# Patient Record
Sex: Male | Born: 1955 | Race: Black or African American | Hispanic: No | Marital: Single | Smoking: Current every day smoker
Health system: Southern US, Community
[De-identification: ages and names within clinical notes are randomized; demographics above are authoritative.]

## PROBLEM LIST (undated history)

## (undated) DIAGNOSIS — M1990 Unspecified osteoarthritis, unspecified site: Secondary | ICD-10-CM

## (undated) DIAGNOSIS — I10 Essential (primary) hypertension: Secondary | ICD-10-CM

## (undated) HISTORY — PX: MULTIPLE TOOTH EXTRACTIONS: SHX2053

## (undated) HISTORY — DX: Unspecified osteoarthritis, unspecified site: M19.90

## (undated) HISTORY — DX: Essential (primary) hypertension: I10

---

## 1975-01-24 HISTORY — PX: TONSILLECTOMY: SUR1361

## 1989-01-23 HISTORY — PX: KNEE SURGERY: SHX244

## 2005-11-15 ENCOUNTER — Ambulatory Visit (HOSPITAL_COMMUNITY): Admission: RE | Admit: 2005-11-15 | Discharge: 2005-11-15 | Payer: Self-pay | Admitting: Dermatology

## 2009-07-31 ENCOUNTER — Emergency Department (HOSPITAL_COMMUNITY): Admission: EM | Admit: 2009-07-31 | Discharge: 2009-07-31 | Payer: Self-pay | Admitting: Emergency Medicine

## 2011-04-06 ENCOUNTER — Other Ambulatory Visit: Payer: Self-pay | Admitting: Family Medicine

## 2011-04-06 ENCOUNTER — Ambulatory Visit
Admission: RE | Admit: 2011-04-06 | Discharge: 2011-04-06 | Disposition: A | Discharge status: Home / Self Care | Payer: 59 | Source: Ambulatory Visit | Attending: Family Medicine | Admitting: Family Medicine

## 2011-04-06 DIAGNOSIS — I10 Essential (primary) hypertension: Secondary | ICD-10-CM

## 2011-04-06 DIAGNOSIS — F17200 Nicotine dependence, unspecified, uncomplicated: Secondary | ICD-10-CM

## 2011-05-04 ENCOUNTER — Encounter: Payer: Self-pay | Admitting: Gastroenterology

## 2011-05-11 ENCOUNTER — Ambulatory Visit (AMBULATORY_SURGERY_CENTER): Payer: 59 | Admitting: *Deleted

## 2011-05-11 VITALS — Ht 75.0 in | Wt 148.0 lb

## 2011-05-11 DIAGNOSIS — Z1211 Encounter for screening for malignant neoplasm of colon: Secondary | ICD-10-CM

## 2011-05-11 MED ORDER — PEG-KCL-NACL-NASULF-NA ASC-C 100 G PO SOLR: ORAL | Status: DC

## 2011-05-25 ENCOUNTER — Encounter: Payer: Self-pay | Admitting: Gastroenterology

## 2011-05-25 ENCOUNTER — Ambulatory Visit (AMBULATORY_SURGERY_CENTER): Payer: 59 | Admitting: Gastroenterology

## 2011-05-25 VITALS — BP 136/81 | HR 73 | Temp 95.4°F | Resp 22 | Ht 75.0 in | Wt 148.0 lb

## 2011-05-25 DIAGNOSIS — Z1211 Encounter for screening for malignant neoplasm of colon: Secondary | ICD-10-CM

## 2011-05-25 HISTORY — PX: COLONOSCOPY: SHX174

## 2011-05-25 MED ORDER — SODIUM CHLORIDE 0.9 % IV SOLN: 500.0000 mL | INTRAVENOUS | Status: DC

## 2011-05-25 NOTE — Progress Notes (Signed)
Patient did not have preoperative order for IV antibiotic SSI prophylaxis. (G8918)   

## 2011-05-25 NOTE — Progress Notes (Signed)
Patient did not experience any of the following events: a burn prior to discharge; a fall within the facility; wrong site/side/patient/procedure/implant event; or a hospital transfer or hospital admission upon discharge from the facility. (G8907)  

## 2011-05-25 NOTE — Patient Instructions (Signed)
YOU HAD AN ENDOSCOPIC PROCEDURE TODAY AT THE [facility]: Refer to the procedure report that was given to you for any specific questions about what was found during the examination.  If the procedure report does not answer your questions, please call your gastroenterologist to clarify.  If you requested that your care partner not be given the details of your procedure findings, then the procedure report has been included in a sealed envelope for you to review at your convenience later.  YOU SHOULD EXPECT: Some feelings of bloating in the abdomen. Passage of more gas than usual.  Walking can help get rid of the air that was put into your GI tract during the procedure and reduce the bloating. If you had a lower endoscopy (such as a colonoscopy or flexible sigmoidoscopy) you may notice spotting of blood in your stool or on the toilet paper. If you underwent a bowel prep for your procedure, then you may not have a normal bowel movement for a few days.  DIET: Your first meal following the procedure should be a light meal and then it is ok to progress to your normal diet.  A half-sandwich or bowl of soup is an example of a good first meal.  Heavy or fried foods are harder to digest and may make you feel nauseous or bloated.  Likewise meals heavy in dairy and vegetables can cause extra gas to form and this can also increase the bloating.  Drink plenty of fluids but you should avoid alcoholic beverages for 24 hours.  ACTIVITY: Your care partner should take you home directly after the procedure.  You should plan to take it easy, moving slowly for the rest of the day.  You can resume normal activity the day after the procedure however you should NOT DRIVE or use heavy machinery for 24 hours (because of the sedation medicines used during the test).    SYMPTOMS TO REPORT IMMEDIATELY: A gastroenterologist can be reached at any hour.  During normal business hours, 8:30 AM to 5:00 PM Monday through Friday,  call [phone].  After hours and on weekends, please call the GI answering service at [phone] who will take a message and have the physician on call contact you.   Following lower endoscopy (colonoscopy or flexible sigmoidoscopy):  Excessive amounts of blood in the stool  Significant tenderness or worsening of abdominal pains  Swelling of the abdomen that is new, acute  Fever of 100F or higher    FOLLOW UP: If any biopsies were taken you will be contacted by phone or by letter within the next 1-3 weeks.  Call your gastroenterologist if you have not heard about the biopsies in 3 weeks.  Our staff will call the home number listed on your records the next business day following your procedure to check on you and address any questions or concerns that you may have at that time regarding the information given to you following your procedure. This is a courtesy call and so if there is no answer at the home number and we have not heard from you through the emergency physician on call, we will assume that you have returned to your regular daily activities without incident.  SIGNATURES/CONFIDENTIALITY: You and/or your care partner have signed paperwork which will be entered into your electronic medical record.  These signatures attest to the fact that that the information above on your After Visit Summary has been reviewed and is understood.  Full responsibility of the confidentiality   of this discharge information lies with you and/or your care-partner.     

## 2011-05-25 NOTE — Op Note (Signed)
[facility] [address] [place], [place]  [address]  COLONOSCOPY PROCEDURE REPORT  PATIENT:  Fred Wise, Fred Wise  MR#:  [id number] BIRTHDATE:  [birth year], 55 yrs. old  GENDER:  male ENDOSCOPIST:  Barbette Hair. Arlyce Dice, MD REF. BY:  Clyda Greener, M.D. PROCEDURE DATE:  05/25/2011 PROCEDURE:  Diagnostic Colonoscopy ASA CLASS:  Class I INDICATIONS:  Routine Risk Screening MEDICATIONS:   MAC sedation, administered by CRNA propofol 200mg IV  DESCRIPTION OF PROCEDURE:   After the risks benefits and alternatives of the procedure were thoroughly explained, informed consent was obtained.  Digital rectal exam was performed and revealed no abnormalities.   The LB [id number]-[id number] endoscope was introduced through the anus and advanced to the cecum, which was identified by both the appendix and ileocecal valve, without limitations.  The quality of the prep was good, using MoviPrep. The instrument was then slowly withdrawn as the colon was fully examined. <<PROCEDUREIMAGES>>  FINDINGS:  A normal appearing cecum, ileocecal valve, and appendiceal orifice were identified. The ascending, hepatic flexure, transverse, splenic flexure, descending, sigmoid colon, and rectum appeared unremarkable (see image1 and image2). Retroflexed views in the rectum revealed no abnormalities.    The time to cecum =  1) 6.25  minutes. The scope was then withdrawn in 1) 6.0  minutes from the cecum and the procedure completed. COMPLICATIONS:  None ENDOSCOPIC IMPRESSION: 1) Normal colon RECOMMENDATIONS: 1) Continue current colorectal screening recommendations for "routine risk" patients with a repeat colonoscopy in 10 years. REPEAT EXAM:  In 10 year(s) for Colonoscopy.  ______________________________ Barbette Hair. Arlyce Dice, MD  CC:  n. eSIGNED:   Barbette Hair. Dario Yono at 05/25/2011 09:31 AM  Judeth Horn, [phone]

## 2011-05-26 ENCOUNTER — Telehealth: Payer: Self-pay

## 2011-05-26 NOTE — Telephone Encounter (Signed)
No answer

## 2012-02-09 ENCOUNTER — Ambulatory Visit
Admission: RE | Admit: 2012-02-09 | Discharge: 2012-02-09 | Disposition: A | Discharge status: Home / Self Care | Payer: PRIVATE HEALTH INSURANCE | Source: Ambulatory Visit | Attending: Family Medicine | Admitting: Family Medicine

## 2012-02-09 ENCOUNTER — Other Ambulatory Visit: Payer: Self-pay | Admitting: Family Medicine

## 2012-02-09 DIAGNOSIS — F17200 Nicotine dependence, unspecified, uncomplicated: Secondary | ICD-10-CM

## 2013-01-13 ENCOUNTER — Other Ambulatory Visit: Payer: Self-pay | Admitting: Family Medicine

## 2013-01-13 ENCOUNTER — Ambulatory Visit
Admission: RE | Admit: 2013-01-13 | Discharge: 2013-01-13 | Disposition: A | Discharge status: Home / Self Care | Payer: PRIVATE HEALTH INSURANCE | Source: Ambulatory Visit | Attending: Family Medicine | Admitting: Family Medicine

## 2013-01-13 DIAGNOSIS — F17200 Nicotine dependence, unspecified, uncomplicated: Secondary | ICD-10-CM

## 2013-01-13 DIAGNOSIS — A699 Spirochetal infection, unspecified: Secondary | ICD-10-CM

## 2014-02-20 IMAGING — CR DG CHEST 2V
2 series · 2 of 2 positions shown · non-contrast
Comparison: Chest x-ray of 04/06/2011

CLINICAL DATA: Smoking history, hypertension

CHEST - 2 VIEW

[w chest lat]
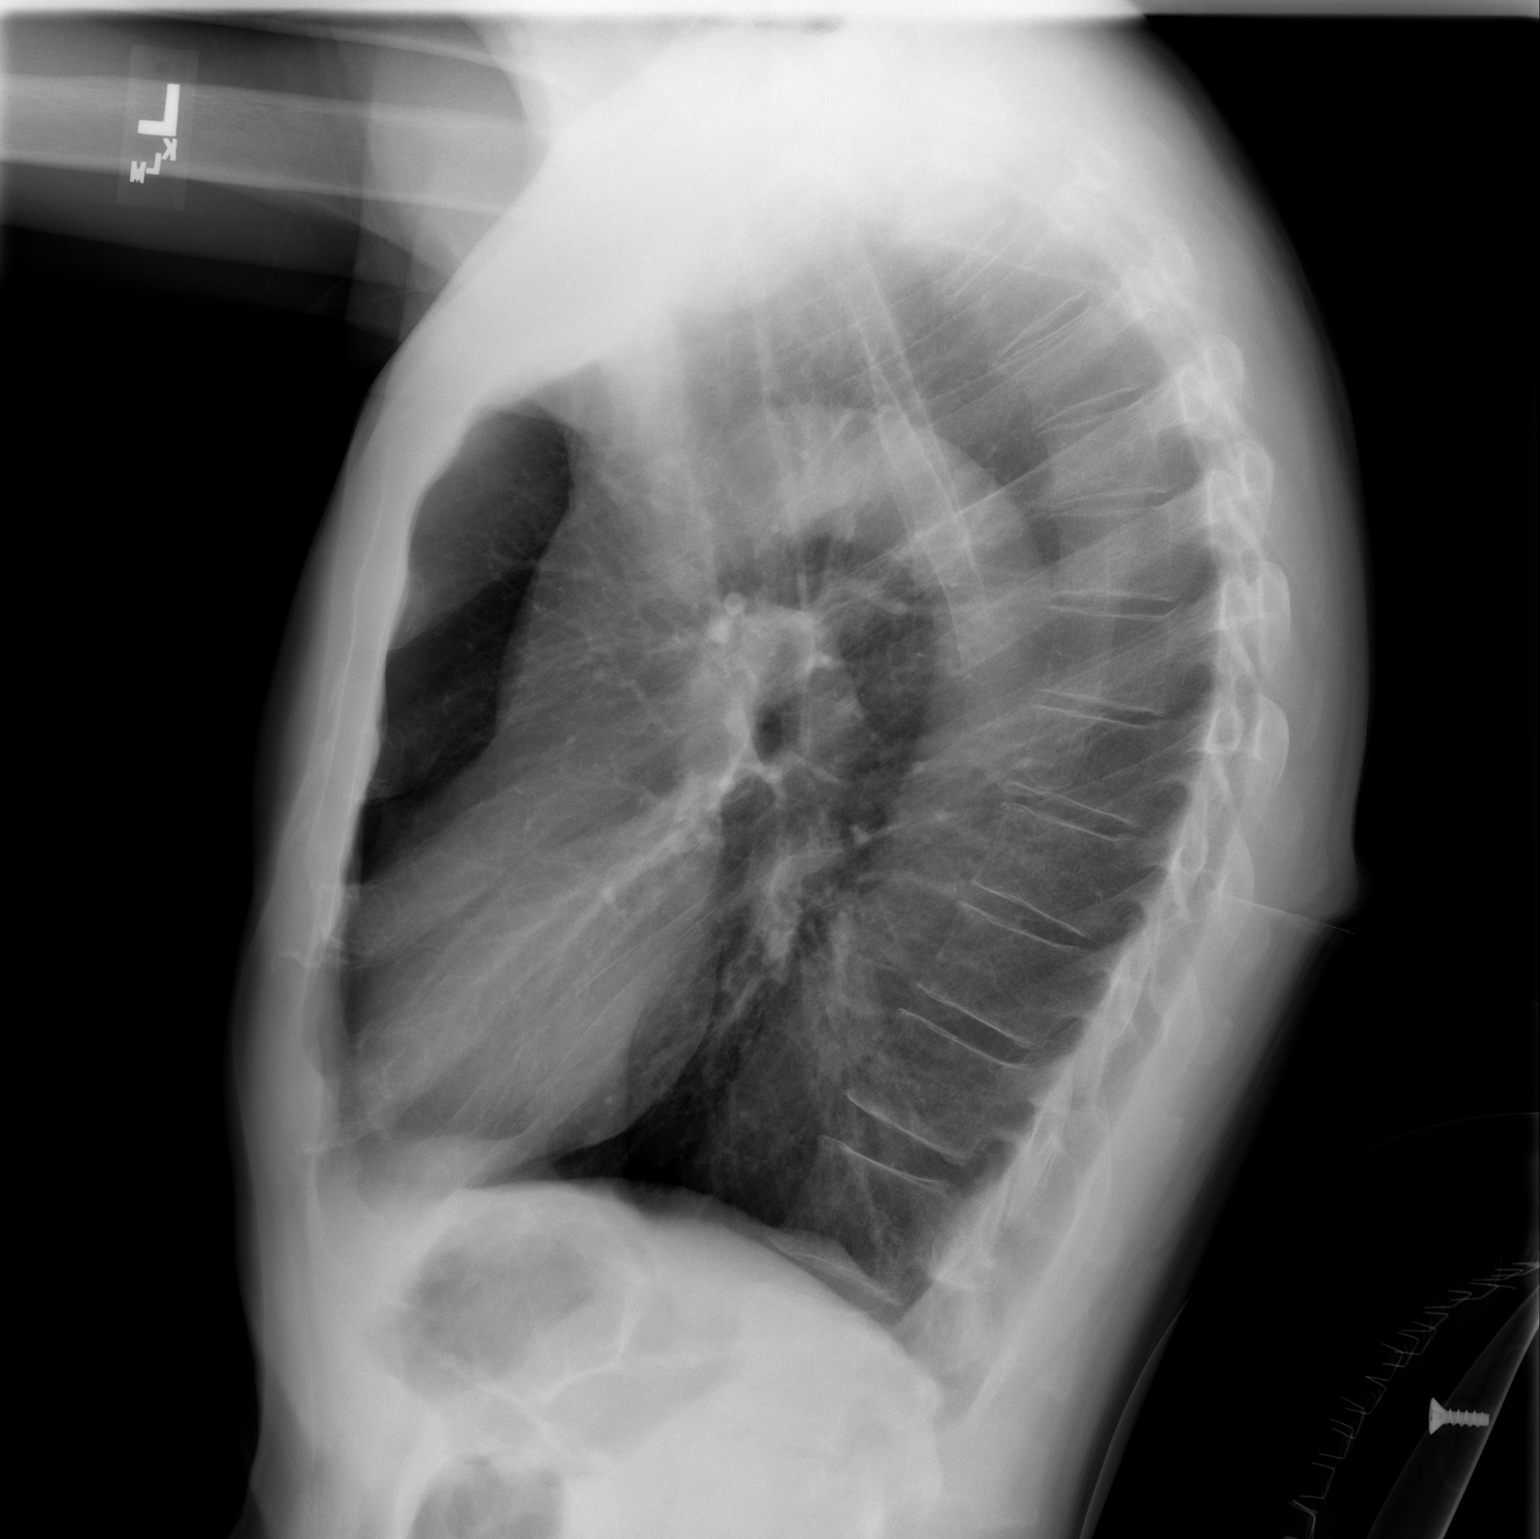

[w chest ap]
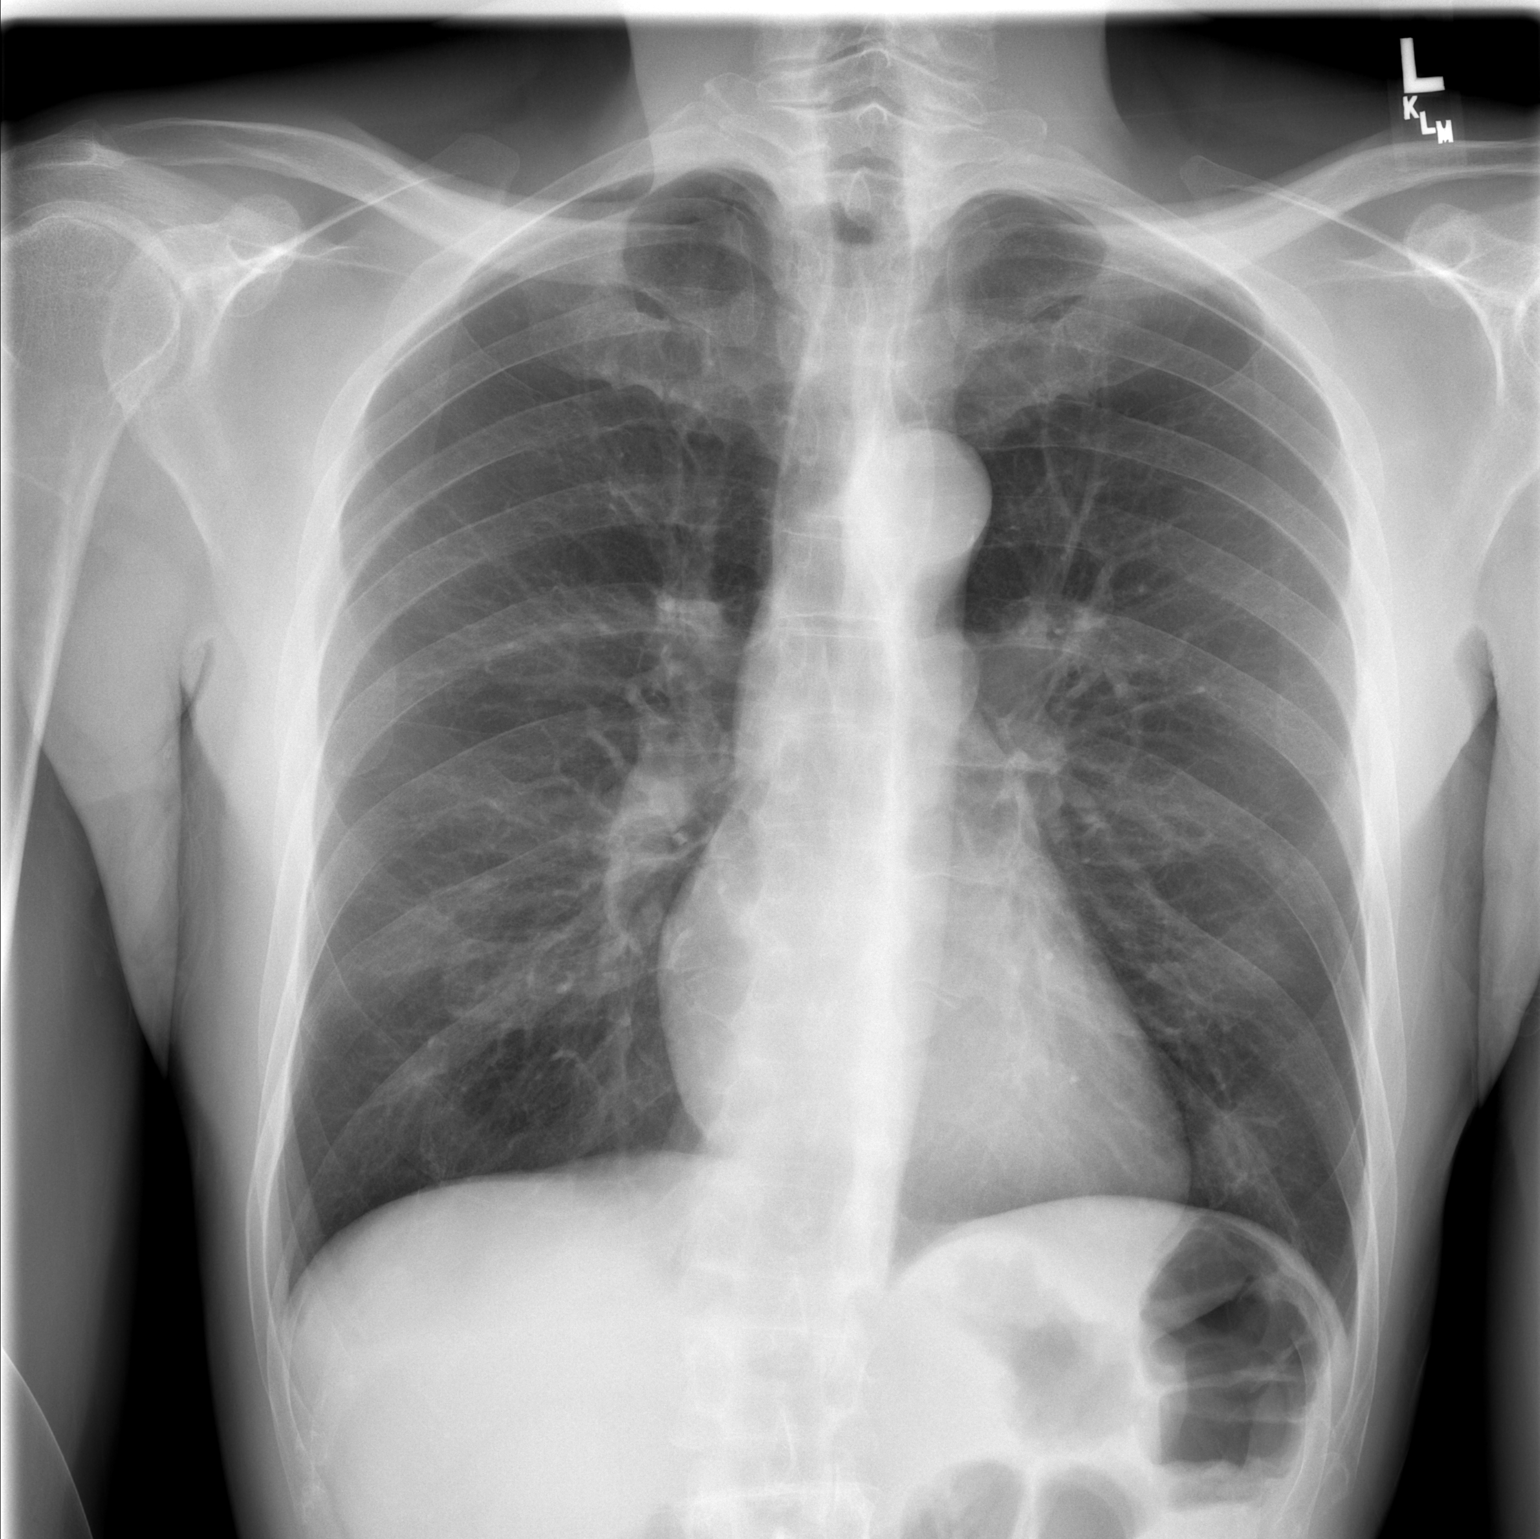

[2 of 2 positions shown; findings below may reference images not displayed]

FINDINGS: The lungs are clear but hyperaerated consistent with
emphysema.  Mediastinal contours are stable.  The heart is within
normal limits in size.  No bony abnormality is seen.
IMPRESSION: Emphysema.  No active lung disease.

## 2014-04-28 ENCOUNTER — Ambulatory Visit
Admission: RE | Admit: 2014-04-28 | Discharge: 2014-04-28 | Disposition: A | Discharge status: Home / Self Care | Payer: PRIVATE HEALTH INSURANCE | Source: Ambulatory Visit | Attending: Family Medicine | Admitting: Family Medicine

## 2014-04-28 ENCOUNTER — Other Ambulatory Visit: Payer: Self-pay | Admitting: Family Medicine

## 2014-04-28 DIAGNOSIS — F17210 Nicotine dependence, cigarettes, uncomplicated: Secondary | ICD-10-CM

## 2014-04-28 DIAGNOSIS — R911 Solitary pulmonary nodule: Secondary | ICD-10-CM

## 2014-05-01 ENCOUNTER — Ambulatory Visit
Admission: RE | Admit: 2014-05-01 | Discharge: 2014-05-01 | Disposition: A | Discharge status: Home / Self Care | Payer: PRIVATE HEALTH INSURANCE | Source: Ambulatory Visit | Attending: Family Medicine | Admitting: Family Medicine

## 2014-05-01 DIAGNOSIS — R911 Solitary pulmonary nodule: Secondary | ICD-10-CM

## 2015-01-25 IMAGING — CR DG CHEST 2V
2 series · 2 of 2 positions shown · non-contrast
Comparison: 02/09/2012

CLINICAL DATA: Spirochetal infection.  Smoker for 40 years.

EXAM:
CHEST  2 VIEW

[w chest pa]
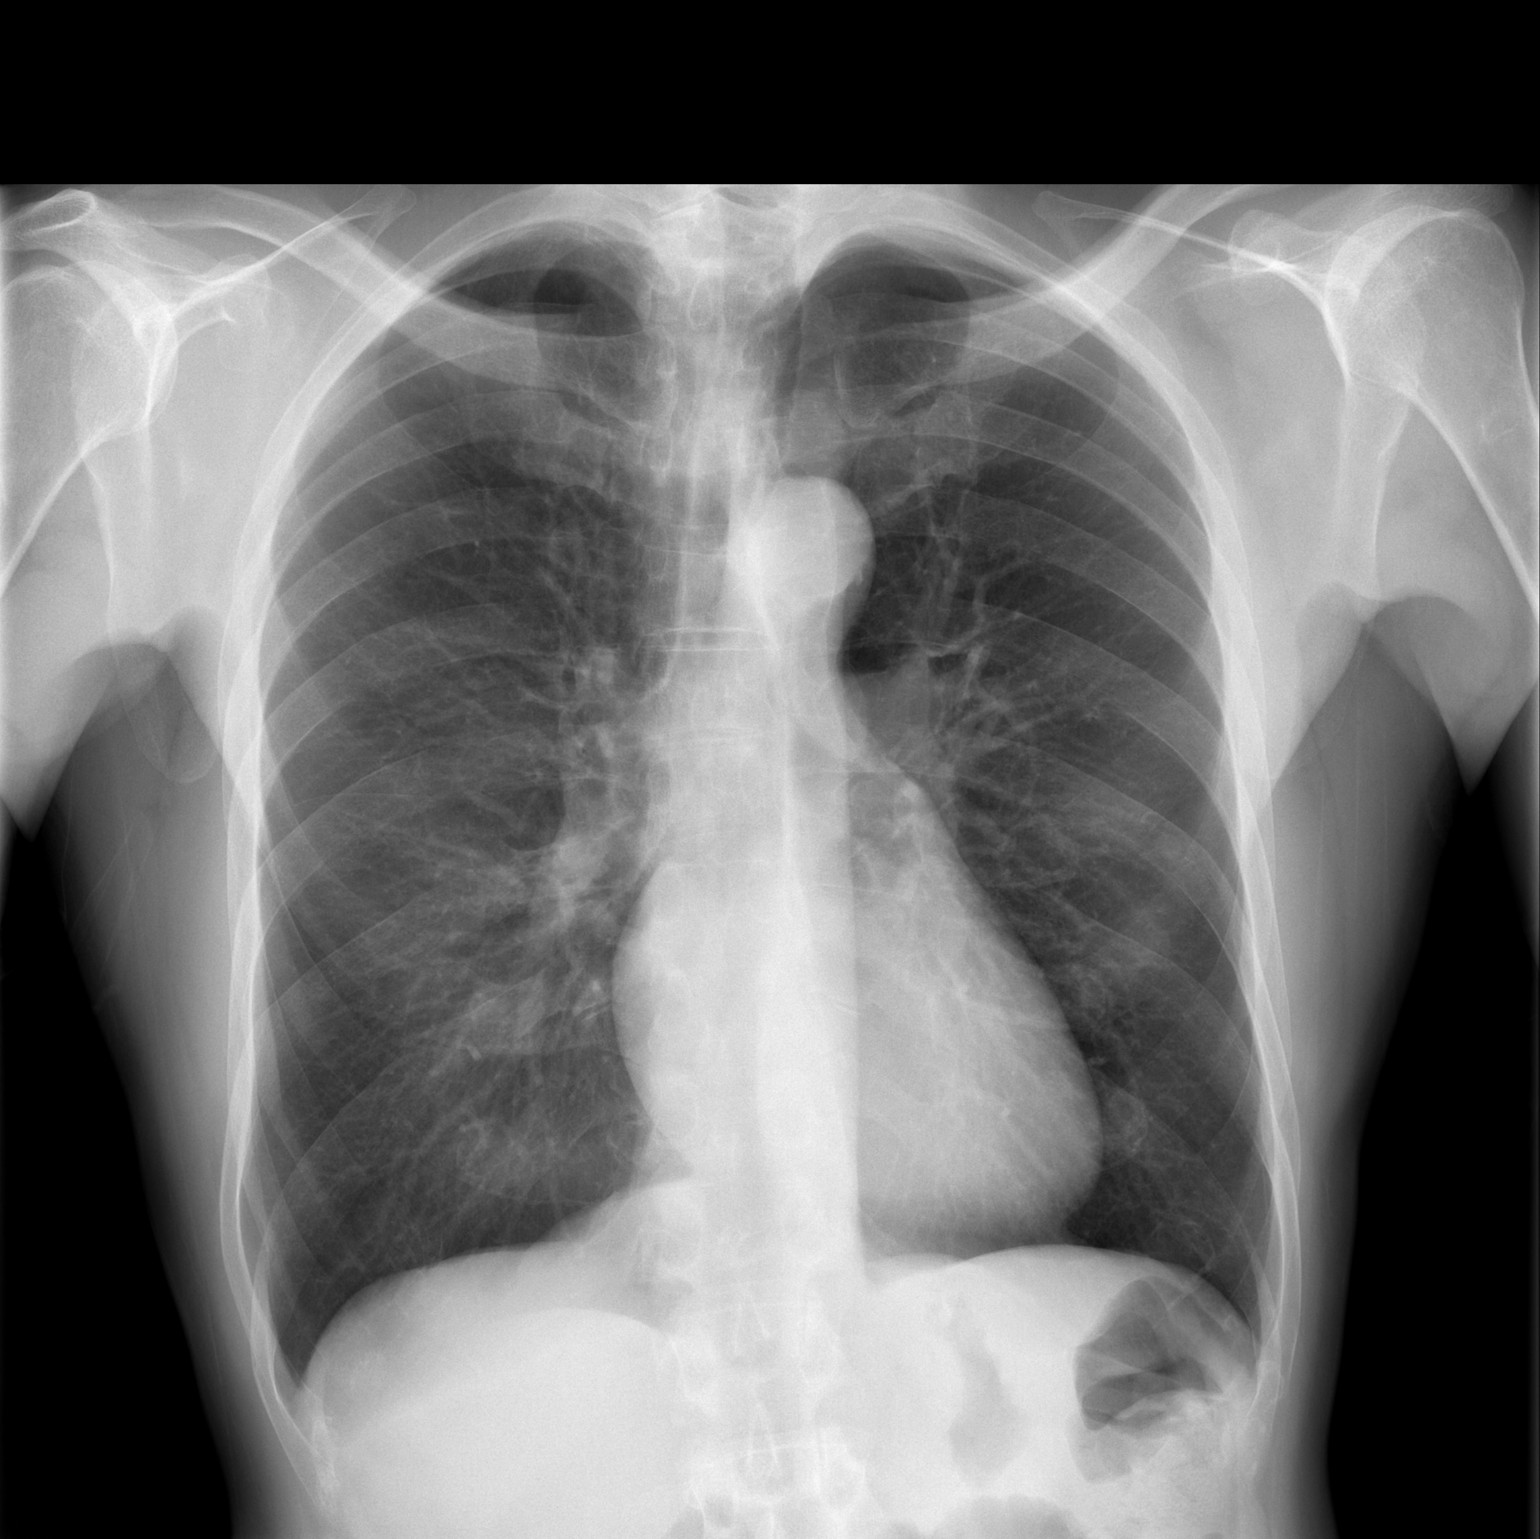

[w chest lat]
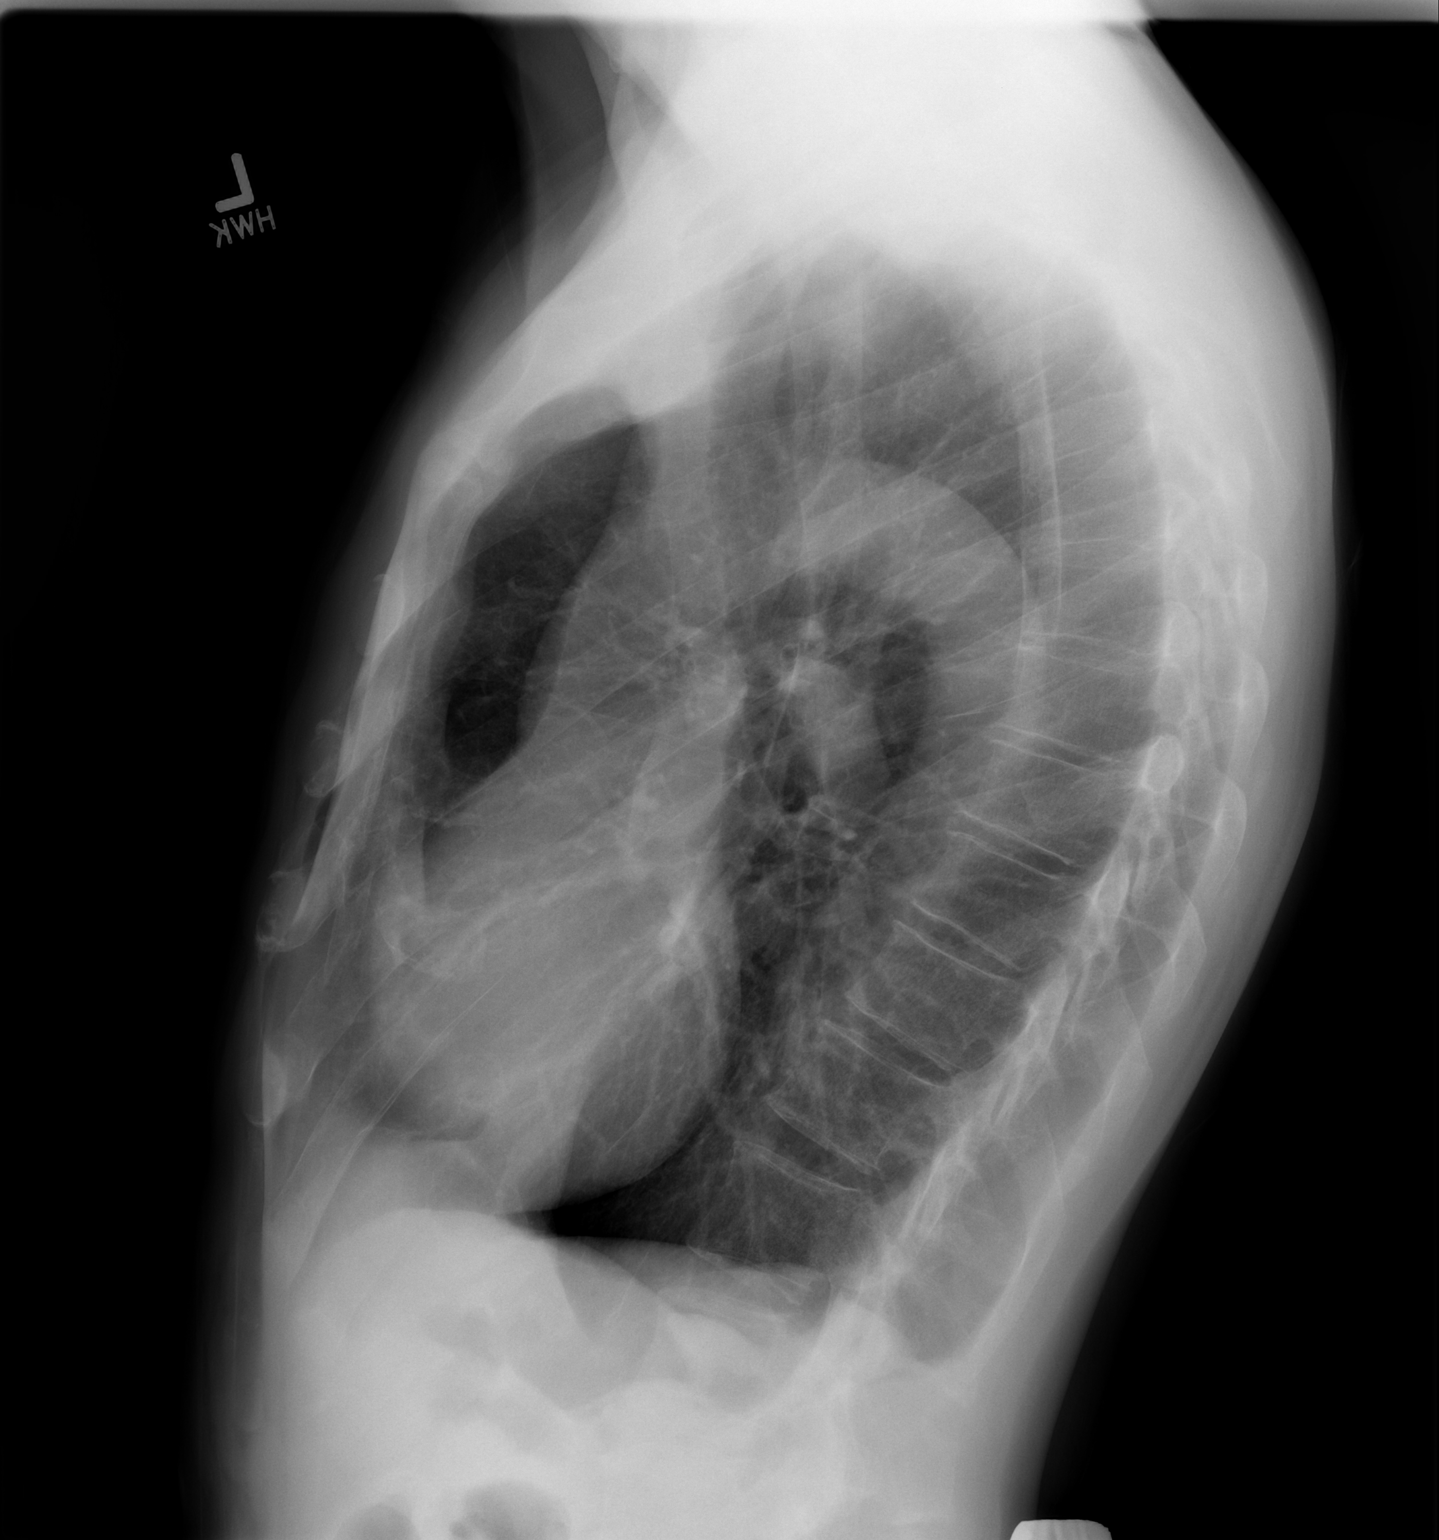

[2 of 2 positions shown; findings below may reference images not displayed]

FINDINGS: Heart size and pulmonary vascularity are normal and the lungs are
clear although hyperinflated. No osseous abnormality.
IMPRESSION: Hyperinflated lungs which could represent emphysema. No acute
abnormality.

## 2015-03-08 ENCOUNTER — Emergency Department (HOSPITAL_COMMUNITY)
Admission: EM | Admit: 2015-03-08 | Discharge: 2015-03-08 | Disposition: A | Discharge status: Home / Self Care | Payer: Medicare Other | Attending: Emergency Medicine | Admitting: Emergency Medicine

## 2015-03-08 ENCOUNTER — Encounter (HOSPITAL_COMMUNITY): Payer: Self-pay | Admitting: *Deleted

## 2015-03-08 DIAGNOSIS — H01004 Unspecified blepharitis left upper eyelid: Secondary | ICD-10-CM | POA: Insufficient documentation

## 2015-03-08 DIAGNOSIS — H5712 Ocular pain, left eye: Secondary | ICD-10-CM | POA: Diagnosis not present

## 2015-03-08 DIAGNOSIS — Z79899 Other long term (current) drug therapy: Secondary | ICD-10-CM | POA: Diagnosis not present

## 2015-03-08 DIAGNOSIS — Z8739 Personal history of other diseases of the musculoskeletal system and connective tissue: Secondary | ICD-10-CM | POA: Insufficient documentation

## 2015-03-08 DIAGNOSIS — I10 Essential (primary) hypertension: Secondary | ICD-10-CM | POA: Insufficient documentation

## 2015-03-08 DIAGNOSIS — H02844 Edema of left upper eyelid: Secondary | ICD-10-CM | POA: Diagnosis not present

## 2015-03-08 DIAGNOSIS — H02846 Edema of left eye, unspecified eyelid: Secondary | ICD-10-CM

## 2015-03-08 DIAGNOSIS — F17210 Nicotine dependence, cigarettes, uncomplicated: Secondary | ICD-10-CM | POA: Insufficient documentation

## 2015-03-08 NOTE — ED Notes (Signed)
Pt is here with left eye swelling that came up 2 days ago.

## 2015-03-08 NOTE — ED Notes (Signed)
Declined W/C at D/C and was escorted to lobby by RN. 

## 2015-03-08 NOTE — Discharge Instructions (Signed)
Please follow-up with your doctor next week for reevaluation as needed. You may continue using ice packs to help with the swelling. Return to ED for new or worsening symptoms as we discussed.

## 2015-03-08 NOTE — ED Provider Notes (Signed)
CSN: [id number]     Arrival date & time 03/08/15  1218 History  By signing my name below, I, Iona Beard, attest that this documentation has been prepared under the direction and in the presence of Velna Hatchet, PA-C.  Electronically Signed: Iona Beard, ED Scribe 03/08/2015 at 2:07 PM.    Chief Complaint  Patient presents with  . Eye Problem    The history is provided by the patient. No language interpreter was used.   HPI Comments: Fred Wise is a 60 y.o. male who presents to the Emergency Department complaining of gradual onset, constant left eyelid swelling onset three days ago. Pt states that he began prescription for arthritis on Thursday and thinks that his eye symptoms may be a reaction to the medication. No other associated symptoms. Denies eye pain, eye drainage, photophobia, ear pain, throat pain, rhinorrhea, visual disturbances, or any other associated symptoms.    Past Medical History  Diagnosis Date  . Hypertension   . Arthritis     knees, uses wheelchair   Past Surgical History  Procedure Laterality Date  . Multiple tooth extractions    . Knee surgery  1991    left  . Tonsillectomy  1977   Family History  Problem Relation Age of Onset  . Colon cancer Neg Hx   . Stomach cancer Neg Hx    Social History  Substance Use Topics  . Smoking status: Current Every Day Smoker -- 0.10 packs/day    Types: Cigarettes  . Smokeless tobacco: Never Used  . Alcohol Use: No    Review of Systems  HENT: Negative for rhinorrhea and sore throat.   Eyes: Positive for pain and redness. Negative for photophobia, discharge, itching and visual disturbance.  Neurological: Negative for headaches.  All other systems reviewed and are negative.   Allergies  Review of patient's allergies indicates no known allergies.  Home Medications   Prior to Admission medications   Medication Sig Start Date End Date Taking? Authorizing Provider  amLODipine (NORVASC) 5 MG tablet  Take 5 mg by mouth daily.    Historical Provider, MD   BP 143/103 mmHg  Pulse 65  Temp(Src) 97.8 F (36.6 C) (Oral)  Resp 18  SpO2 99% Physical Exam  Constitutional: He appears well-developed and well-nourished. No distress.  HENT:  Head: Normocephalic and atraumatic.  Eyes: Conjunctivae and EOM are normal. Pupils are equal, round, and reactive to light. Right eye exhibits no discharge. Left eye exhibits no discharge. No scleral icterus.  Mild swelling of left, upper eyelid. No TTP. No discharge.  No erythema or other sign of infection.   Neck: Neck supple. No tracheal deviation present.  Cardiovascular: Normal rate.   Pulmonary/Chest: Effort normal. No respiratory distress.  Musculoskeletal: Normal range of motion.  Neurological: He is alert.  Skin: Skin is warm and dry.  Psychiatric: He has a normal mood and affect. His behavior is normal.    ED Course  Procedures (including critical care time) DIAGNOSTIC STUDIES: Oxygen Saturation is 99% on RA, normal by my interpretation.    COORDINATION OF CARE:  2:01 PM-Discussed treatment plan which includes cold compression for swelling with pt at bedside and pt agreed to plan.   Labs Review Labs Reviewed - No data to display  Imaging Review No results found.   EKG Interpretation None     Meds given in ED:  Medications - No data to display  Discharge Medication List as of 03/08/2015  2:07 PM     Filed  Vitals:   03/08/15 1337 03/08/15 1415  BP: 143/103 158/85  Pulse: 65 78  Temp: 97.8 F (36.6 C) 98 F (36.7 C)  TempSrc: Oral Oral  Resp: 18 18  SpO2: 99% 100%    MDM  Adilson Grafton is a 60 y.o. male options for evaluation of left eye swelling. On exam, patient does have left eyelid swelling, very mild. Consistent with mild blepharitis. No evidence of stye/hordeolum, infection or other acute ocular emergency. Encouraged continue cool compresses and follow-up with PCP. He verbalizes understanding and agrees with  plan. Hemodynamically stable, afebrile and appropriate for discharge.  The patient appears reasonably screened and/or stabilized for discharge and I doubt any other medical condition or other [facility] requiring further screening, evaluation, or treatment in the ED at this time prior to discharge.   Final diagnoses:  Swelling of eyelid, left  I personally performed the services described in this documentation, which was scribed in my presence. The recorded information has been reviewed and is accurate.      Joycie Peek, PA-C 03/08/15 1438  Arby Barrette, MD 03/10/15 [phone]

## 2016-05-09 IMAGING — CR DG CHEST 2V
2 series · 2 of 2 positions shown · non-contrast
Comparison: 01/13/2013.

CLINICAL DATA: Heavy smoker.  Hypertension.

EXAM:
CHEST  2 VIEW

[w chest pa]
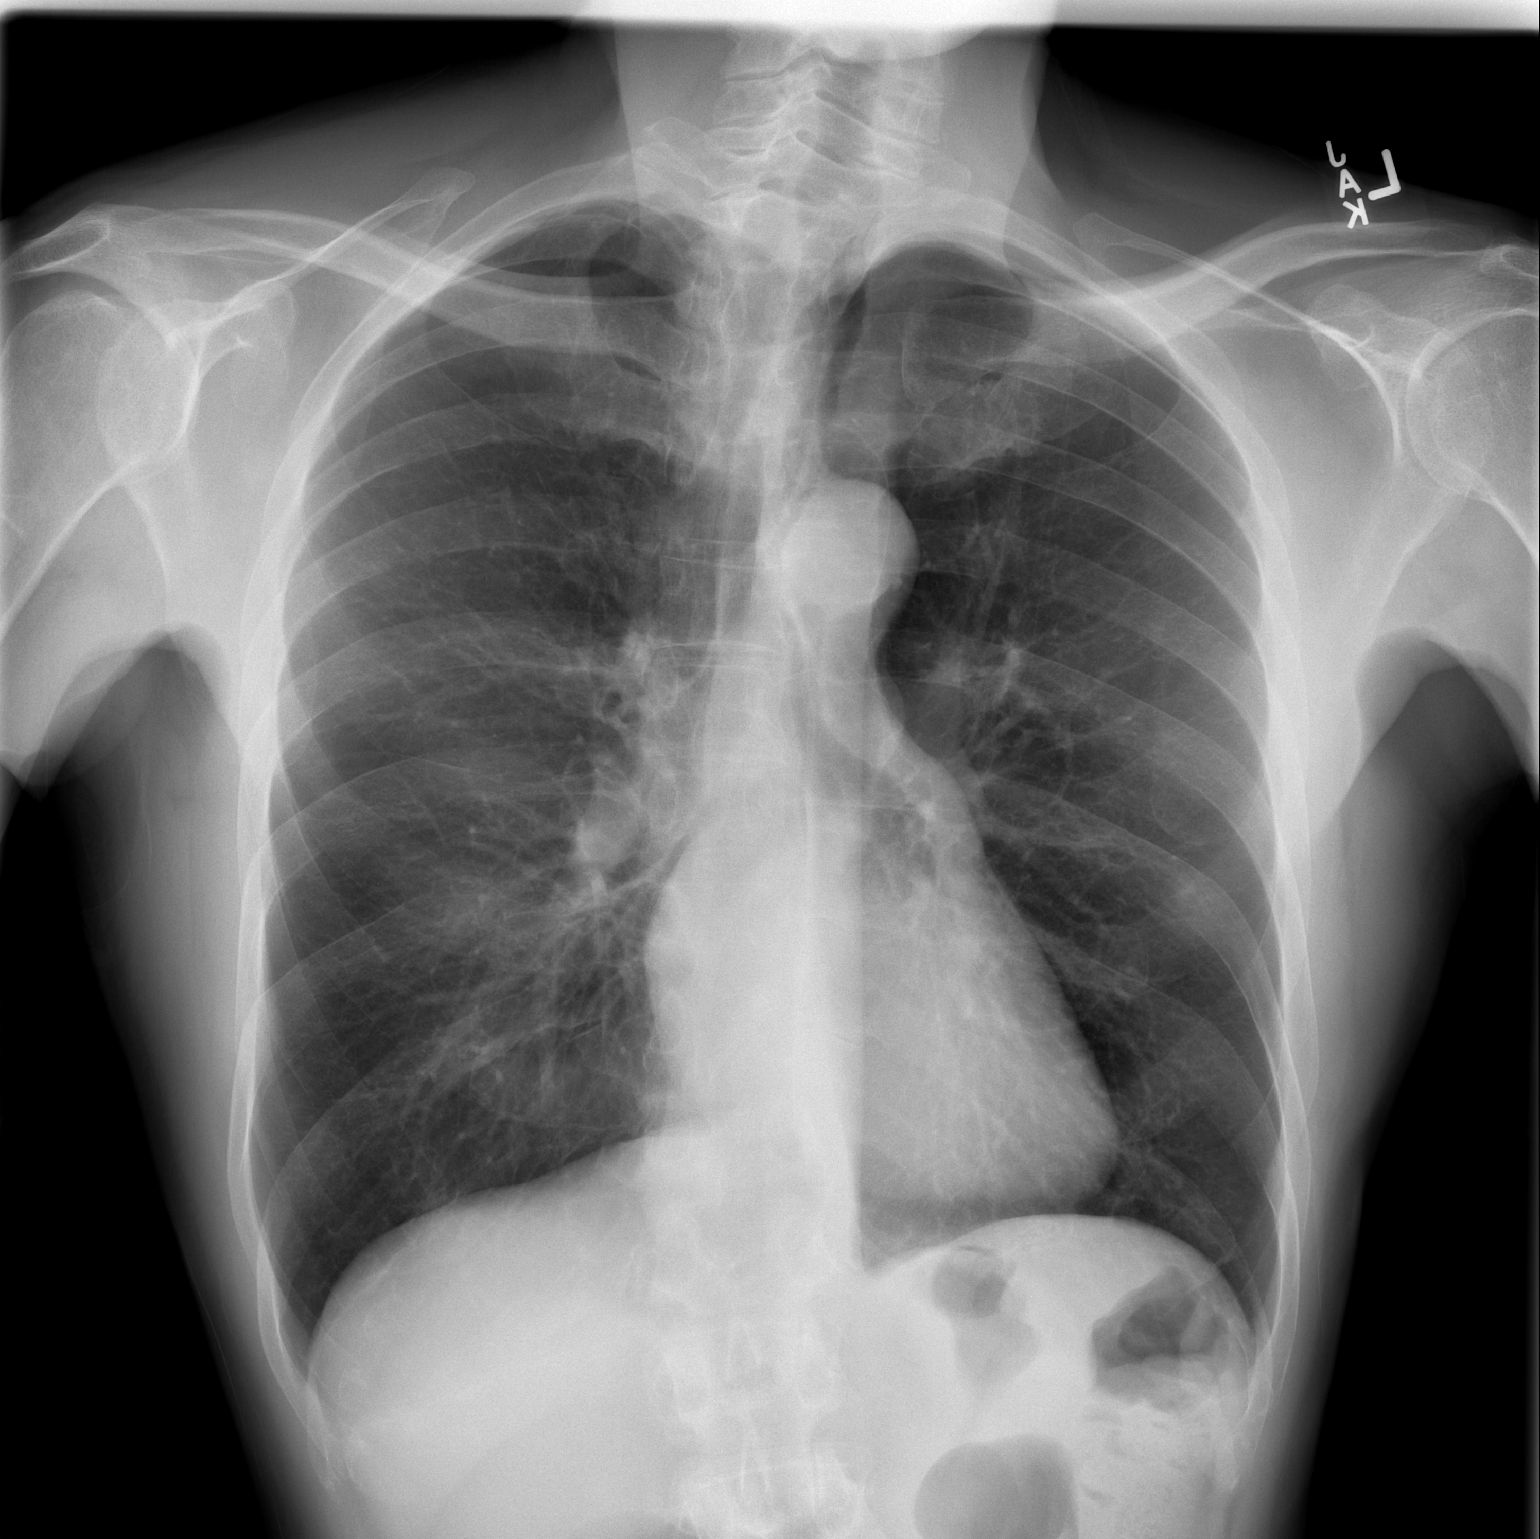

[w chest lat]
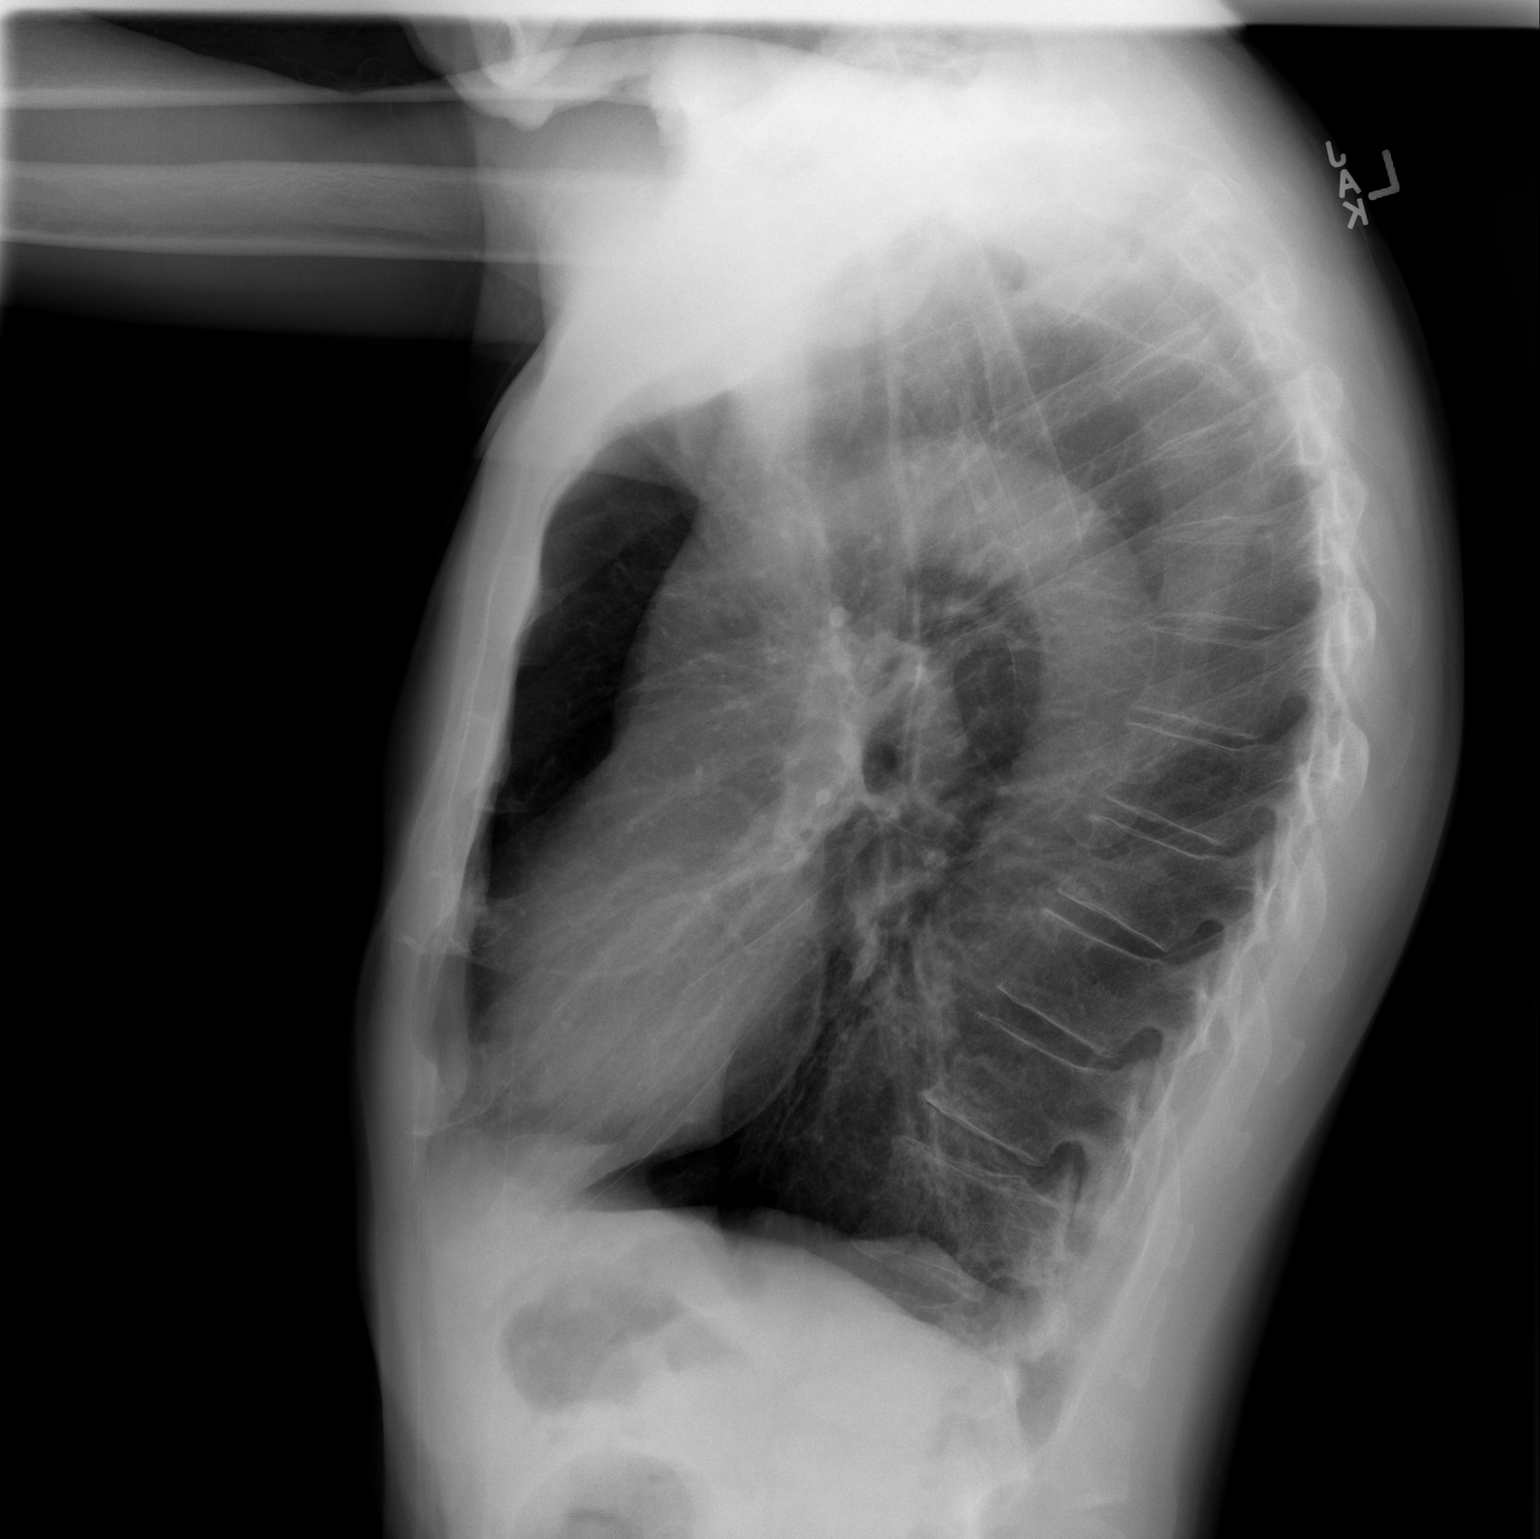

[2 of 2 positions shown; findings below may reference images not displayed]

FINDINGS: Cardiopericardial silhouette is within normal limits. Mediastinal
contours are normal. The lungs are hyperinflated compatible with
emphysema. There is a faint density in the LEFT mid lung, not well
seen on the lateral view. This appears changed compared to the prior
exam from 7003, with poor definition of the margins on today's
examination. This could represent an atypical appearance of a nipple
shadow however given the asymmetry it is more concerning for
pulmonary nodule.
IMPRESSION: 1. Emphysema.
2. LEFT mid lung nodular density. Pulmonary nodule not excluded.
Followup noncontrast chest CT recommended.
3. These results will be called to the ordering clinician or
representative by the Radiologist Assistant, and communication
documented in the PACS or zVision Dashboard.

## 2018-02-16 ENCOUNTER — Observation Stay (HOSPITAL_COMMUNITY)
Admission: EM | Admit: 2018-02-16 | Discharge: 2018-02-17 | Disposition: A | Discharge status: Home / Self Care | Payer: Medicare Other | Attending: Family Medicine | Admitting: Family Medicine

## 2018-02-16 ENCOUNTER — Other Ambulatory Visit: Payer: Self-pay

## 2018-02-16 ENCOUNTER — Emergency Department (HOSPITAL_COMMUNITY): Payer: Medicare Other

## 2018-02-16 DIAGNOSIS — J101 Influenza due to other identified influenza virus with other respiratory manifestations: Principal | ICD-10-CM | POA: Diagnosis present

## 2018-02-16 DIAGNOSIS — E440 Moderate protein-calorie malnutrition: Secondary | ICD-10-CM | POA: Diagnosis not present

## 2018-02-16 DIAGNOSIS — Z791 Long term (current) use of non-steroidal anti-inflammatories (NSAID): Secondary | ICD-10-CM | POA: Diagnosis not present

## 2018-02-16 DIAGNOSIS — F17210 Nicotine dependence, cigarettes, uncomplicated: Secondary | ICD-10-CM | POA: Insufficient documentation

## 2018-02-16 DIAGNOSIS — Z681 Body mass index (BMI) 19 or less, adult: Secondary | ICD-10-CM | POA: Diagnosis not present

## 2018-02-16 DIAGNOSIS — I10 Essential (primary) hypertension: Secondary | ICD-10-CM | POA: Diagnosis not present

## 2018-02-16 DIAGNOSIS — M6281 Muscle weakness (generalized): Secondary | ICD-10-CM | POA: Diagnosis not present

## 2018-02-16 DIAGNOSIS — E43 Unspecified severe protein-calorie malnutrition: Secondary | ICD-10-CM

## 2018-02-16 DIAGNOSIS — J9601 Acute respiratory failure with hypoxia: Secondary | ICD-10-CM

## 2018-02-16 DIAGNOSIS — J111 Influenza due to unidentified influenza virus with other respiratory manifestations: Secondary | ICD-10-CM | POA: Diagnosis present

## 2018-02-16 DIAGNOSIS — M170 Bilateral primary osteoarthritis of knee: Secondary | ICD-10-CM | POA: Insufficient documentation

## 2018-02-16 DIAGNOSIS — R0902 Hypoxemia: Secondary | ICD-10-CM | POA: Diagnosis not present

## 2018-02-16 DIAGNOSIS — R2681 Unsteadiness on feet: Secondary | ICD-10-CM | POA: Insufficient documentation

## 2018-02-16 DIAGNOSIS — Z79899 Other long term (current) drug therapy: Secondary | ICD-10-CM | POA: Diagnosis not present

## 2018-02-16 LAB — BASIC METABOLIC PANEL
Anion gap: 14 (ref 5–15)
BUN: 13 mg/dL (ref 8–23)
CALCIUM: 9 mg/dL (ref 8.9–10.3)
CHLORIDE: 100 mmol/L (ref 98–111)
CO2: 23 mmol/L (ref 22–32)
CREATININE: 1.02 mg/dL (ref 0.61–1.24)
GFR calc Af Amer: >60 mL/min (ref >60)
GFR calc non Af Amer: >60 mL/min (ref >60)
GLUCOSE: 84 mg/dL (ref 70–99)
Potassium: 3.6 mmol/L (ref 3.5–5.1)
Sodium: 137 mmol/L (ref 135–145)

## 2018-02-16 LAB — INFLUENZA PANEL BY PCR (TYPE A & B)
INFLBPCR: NEGATIVE
Influenza A By PCR: POSITIVE — AB

## 2018-02-16 LAB — CBC WITH DIFFERENTIAL/PLATELET
Abs Immature Granulocytes: 0.02 10*3/uL (ref 0.00–0.07)
BASOS ABS: 0 10*3/uL (ref 0.0–0.1)
Basophils Relative: 0 %
EOS ABS: 0.2 10*3/uL (ref 0.0–0.5)
EOS PCT: 3 %
HEMATOCRIT: 48.6 % (ref 39.0–52.0)
HEMOGLOBIN: 15.8 g/dL (ref 13.0–17.0)
Immature Granulocytes: 0 %
LYMPHS ABS: 0.9 10*3/uL (ref 0.7–4.0)
LYMPHS PCT: 15 %
MCH: 29.5 pg (ref 26.0–34.0)
MCHC: 32.5 g/dL (ref 30.0–36.0)
MCV: 90.8 fL (ref 80.0–100.0)
MONO ABS: 0.7 10*3/uL (ref 0.1–1.0)
Monocytes Relative: 11 %
NRBC: 0 % (ref 0.0–0.2)
Neutro Abs: 4.5 10*3/uL (ref 1.7–7.7)
Neutrophils Relative %: 71 %
Platelets: 192 10*3/uL (ref 150–400)
RBC: 5.35 MIL/uL (ref 4.22–5.81)
RDW: 14.5 % (ref 11.5–15.5)
WBC: 6.3 10*3/uL (ref 4.0–10.5)

## 2018-02-16 LAB — HIV ANTIBODY (ROUTINE TESTING W REFLEX): HIV Screen 4th Generation wRfx: NONREACTIVE

## 2018-02-16 MED ORDER — IPRATROPIUM-ALBUTEROL 0.5-2.5 (3) MG/3ML IN SOLN
3.0000 mL | Freq: Two times a day (BID) | RESPIRATORY_TRACT | Status: DC
  Administered 2018-02-17: 3 mL via RESPIRATORY_TRACT
  Filled 2018-02-16: qty 3

## 2018-02-16 MED ORDER — ACETAMINOPHEN 325 MG PO TABS: 650.0000 mg | ORAL_TABLET | Freq: Four times a day (QID) | ORAL | Status: DC | PRN

## 2018-02-16 MED ORDER — ENSURE ENLIVE PO LIQD
237.0000 mL | Freq: Three times a day (TID) | ORAL | Status: DC
  Administered 2018-02-16 – 2018-02-17 (×2): 237 mL via ORAL

## 2018-02-16 MED ORDER — OSELTAMIVIR PHOSPHATE 75 MG PO CAPS
75.0000 mg | ORAL_CAPSULE | Freq: Two times a day (BID) | ORAL | Status: DC
  Administered 2018-02-16 – 2018-02-17 (×2): 75 mg via ORAL
  Filled 2018-02-16 (×2): qty 1

## 2018-02-16 MED ORDER — ALBUTEROL SULFATE (2.5 MG/3ML) 0.083% IN NEBU: 2.5000 mg | INHALATION_SOLUTION | RESPIRATORY_TRACT | Status: DC | PRN

## 2018-02-16 MED ORDER — SODIUM CHLORIDE 0.9 % IV BOLUS
1000.0000 mL | Freq: Once | INTRAVENOUS | Status: AC
  Administered 2018-02-16: 1000 mL via INTRAVENOUS

## 2018-02-16 MED ORDER — ALBUTEROL SULFATE (2.5 MG/3ML) 0.083% IN NEBU: 2.5000 mg | INHALATION_SOLUTION | Freq: Four times a day (QID) | RESPIRATORY_TRACT | Status: DC

## 2018-02-16 MED ORDER — ENOXAPARIN SODIUM 40 MG/0.4ML ~~LOC~~ SOLN
40.0000 mg | SUBCUTANEOUS | Status: DC
  Administered 2018-02-16: 40 mg via SUBCUTANEOUS
  Filled 2018-02-16: qty 0.4

## 2018-02-16 MED ORDER — IPRATROPIUM-ALBUTEROL 0.5-2.5 (3) MG/3ML IN SOLN
3.0000 mL | Freq: Four times a day (QID) | RESPIRATORY_TRACT | Status: DC
  Administered 2018-02-16 (×2): 3 mL via RESPIRATORY_TRACT
  Filled 2018-02-16 (×2): qty 3

## 2018-02-16 MED ORDER — OSELTAMIVIR PHOSPHATE 75 MG PO CAPS
75.0000 mg | ORAL_CAPSULE | Freq: Once | ORAL | Status: AC
  Administered 2018-02-16: 75 mg via ORAL
  Filled 2018-02-16: qty 1

## 2018-02-16 MED ORDER — BENZONATATE 100 MG PO CAPS: 200.0000 mg | ORAL_CAPSULE | Freq: Three times a day (TID) | ORAL | Status: DC | PRN

## 2018-02-16 MED ORDER — ADULT MULTIVITAMIN W/MINERALS CH
1.0000 | ORAL_TABLET | Freq: Every day | ORAL | Status: DC
  Administered 2018-02-16 – 2018-02-17 (×2): 1 via ORAL
  Filled 2018-02-16 (×2): qty 1

## 2018-02-16 MED ORDER — BENZONATATE 100 MG PO CAPS
200.0000 mg | ORAL_CAPSULE | Freq: Once | ORAL | Status: AC
  Administered 2018-02-16: 200 mg via ORAL
  Filled 2018-02-16: qty 2

## 2018-02-16 MED ORDER — IPRATROPIUM-ALBUTEROL 0.5-2.5 (3) MG/3ML IN SOLN
3.0000 mL | Freq: Once | RESPIRATORY_TRACT | Status: AC
  Administered 2018-02-16: 3 mL via RESPIRATORY_TRACT
  Filled 2018-02-16: qty 3

## 2018-02-16 MED ORDER — AMLODIPINE BESYLATE 5 MG PO TABS
5.0000 mg | ORAL_TABLET | Freq: Every day | ORAL | Status: DC
  Administered 2018-02-16 – 2018-02-17 (×2): 5 mg via ORAL
  Filled 2018-02-16 (×2): qty 1

## 2018-02-16 MED ORDER — IPRATROPIUM BROMIDE 0.02 % IN SOLN
0.5000 mg | Freq: Four times a day (QID) | RESPIRATORY_TRACT | Status: DC
  Administered 2018-02-16: 0.5 mg via RESPIRATORY_TRACT
  Filled 2018-02-16: qty 2.5

## 2018-02-16 MED ORDER — METHYLPREDNISOLONE SODIUM SUCC 40 MG IJ SOLR
40.0000 mg | Freq: Four times a day (QID) | INTRAMUSCULAR | Status: DC
  Administered 2018-02-16 – 2018-02-17 (×5): 40 mg via INTRAVENOUS
  Filled 2018-02-16 (×5): qty 1

## 2018-02-16 NOTE — H&P (Signed)
History and Physical    Fred Wise GGE:[id number] DOB: 12-23-1955 DOA: 02/16/2018  PCP: Gilda Crease, MD  Patient coming from: Home  I have personally briefly reviewed patient's old medical records in [facility]  Chief Complaint: ILI  HPI: Fred Wise is a 63 y.o. male with medical history significant of HTN.  Patient presents to the ED with c/o cough, fever, runny nose, SOB.  Symptoms onset 3-4 days ago, persistent.  Nothing makes better or worse.  No known h/o COPD.   ED Course: Influenza A positive.  SOB and desats to 83% with ambulation.  Satting 95% at rest however on RA.  EDP requests admission.   Review of Systems: As per HPI otherwise 10 point review of systems negative.   Past Medical History:  Diagnosis Date  . Arthritis    knees, uses wheelchair  . Hypertension     Past Surgical History:  Procedure Laterality Date  . KNEE SURGERY  1991   left  . MULTIPLE TOOTH EXTRACTIONS    . TONSILLECTOMY  1977     reports that he has been smoking cigarettes. He has been smoking about 0.10 packs per day. He has never used smokeless tobacco. He reports that he does not drink alcohol or use drugs.  No Known Allergies  Family History  Problem Relation Age of Onset  . Colon cancer Neg Hx   . Stomach cancer Neg Hx      Prior to Admission medications   Medication Sig Start Date End Date Taking? Authorizing Provider  amLODipine (NORVASC) 5 MG tablet Take 5 mg by mouth daily.   Yes [provider]  meloxicam (MOBIC) 7.5 MG tablet Take 7.5 mg by mouth daily.   Yes [provider]    Physical Exam: Vitals:   02/16/18 0054 02/16/18 0100 02/16/18 0200 02/16/18 0230  BP:  (!) 140/94 (!) 135/96 (!) 174/88  Pulse:  (!) 119 (!) 116 (!) 115  Resp:  (!) 25 (!) 26 (!) 22  Temp:      SpO2:  95% 96% 96%  Height: 6\' 3"  (1.905 m)       Constitutional: NAD, calm, comfortable Eyes: PERRL, lids and conjunctivae normal ENMT: Mucous membranes are  moist. Posterior pharynx clear of any exudate or lesions.Normal dentition.  Neck: normal, supple, no masses, no thyromegaly Respiratory: Few wheezes bilaterally. Cardiovascular: Mild tachycardia.  Abdomen: no tenderness, no masses palpated. No hepatosplenomegaly. Bowel sounds positive.  Musculoskeletal: no clubbing / cyanosis. No joint deformity upper and lower extremities. Good ROM, no contractures. Normal muscle tone.  Skin: no rashes, lesions, ulcers. No induration Neurologic: CN 2-12 grossly intact. Sensation intact, DTR normal. Strength 5/5 in all 4.  Psychiatric: Normal judgment and insight. Alert and oriented x 3. Normal mood.    Labs on Admission: I have personally reviewed following labs and imaging studies  CBC: Recent Labs  Lab 02/16/18 0149  WBC 6.3  NEUTROABS 4.5  HGB 15.8  HCT 48.6  MCV 90.8  PLT 192   Basic Metabolic Panel: Recent Labs  Lab 02/16/18 0149  NA 137  K 3.6  CL 100  CO2 23  GLUCOSE 84  BUN 13  CREATININE 1.02  CALCIUM 9.0   GFR: CrCl cannot be calculated (Unknown ideal weight.). Liver Function Tests: No results for input(s): AST, ALT, ALKPHOS, BILITOT, PROT, ALBUMIN in the last 168 hours. No results for input(s): LIPASE, AMYLASE in the last 168 hours. No results for input(s): AMMONIA in the last 168 hours.  Coagulation Profile: No results for input(s): INR, PROTIME in the last 168 hours. Cardiac Enzymes: No results for input(s): CKTOTAL, CKMB, CKMBINDEX, TROPONINI in the last 168 hours. BNP (last 3 results) No results for input(s): PROBNP in the last 8760 hours. HbA1C: No results for input(s): HGBA1C in the last 72 hours. CBG: No results for input(s): GLUCAP in the last 168 hours. Lipid Profile: No results for input(s): CHOL, HDL, LDLCALC, TRIG, CHOLHDL, LDLDIRECT in the last 72 hours. Thyroid Function Tests: No results for input(s): TSH, T4TOTAL, FREET4, T3FREE, THYROIDAB in the last 72 hours. Anemia Panel: No results for input(s):  VITAMINB12, FOLATE, FERRITIN, TIBC, IRON, RETICCTPCT in the last 72 hours. Urine analysis: No results found for: COLORURINE, APPEARANCEUR, LABSPEC, PHURINE, GLUCOSEU, HGBUR, BILIRUBINUR, KETONESUR, PROTEINUR, UROBILINOGEN, NITRITE, LEUKOCYTESUR  Radiological Exams on Admission: Dg Chest 2 View  Result Date: 02/16/2018 CLINICAL DATA:  Cough EXAM: CHEST - 2 VIEW COMPARISON:  Chest CT 05/01/2014 FINDINGS: The lungs are hyperinflated with diffuse interstitial prominence. No focal airspace consolidation or pulmonary edema. No pleural effusion or pneumothorax. Normal cardiomediastinal contours. IMPRESSION: COPD without acute airspace disease. Electronically Signed   By: Deatra RobinsonKevin  Herman M.D.   On: 02/16/2018 01:33    EKG: Independently reviewed.  Assessment/Plan Principal Problem:   Influenza A Active Problems:   Hypoxia   Essential hypertension    1. Influenza A - 1. Tamiflu 2. Tylenol PRN fever 2. Hypoxia - 1. Only with ambulation 2. satting mid 90s at rest at this point 3. Scheduled atrovent 4. PRN albuterol 3. HTN - 1. Continue home amlodipine  DVT prophylaxis: Lovenox Code Status: Full Family Communication: No family in room Disposition Plan: Home after admit Consults called: None Admission status: Place in obs   Tamico Mundo, Heywood IlesJARED M. DO [facility] Pager [phone] Only works nights!  If 7AM-7PM, please contact the primary day team physician taking care of patient  [email or web address] Password [email or web address]  02/16/2018, 5:01 AM

## 2018-02-16 NOTE — Progress Notes (Signed)
New Admission Note:  Arrival Method: By stretcher from ED around [phone] Mental Orientation: Alert and oriented Telemetry: None Assessment: Completed Skin: Completed, refer to flowsheets IV: Left A.C. Pain: Denies Tubes: None Safety Measures: Safety Fall Prevention Plan was given, discussed  Admission: Completed [facility] Orientation: Patient has been orientated to the room, unit and the staff. Family: None  Orders have been reviewed and implemented. Will continue to monitor the patient. Call light has been placed within reach and bed alarm has been activated.   Alfonse Ras, RN  Phone Number: [phone]

## 2018-02-16 NOTE — ED Notes (Signed)
Pt. Tried walking but quickly became SOB and asking to sit down. Sats dropped down to 83% and quickly returned to 95% once seated and taking deep breaths.

## 2018-02-16 NOTE — Evaluation (Signed)
Physical Therapy Evaluation Patient Details Name: Fred Wise MRN: [id number] DOB: 1955/08/07 Today's Date: 02/16/2018   History of Present Illness  Pt is a 63 y/o male who presents with multiple day history of cough and cold symptoms. Pt was found to be Flu type A positive, with hypoxia noted on ambulation. PMH significant for L knee surgery 1991, Bilateral knee arthritis, HTN.   Clinical Impression  Pt admitted with above diagnosis. Pt currently with functional limitations due to the deficits listed below (see PT Problem List). At the time of PT eval pt was able to perform transfers and ambulation with occasional min assist for balance support and safety with gait training. Pt maintaining sats >90% at rest on RA, however with activity, sats began to drop slightly. With coughing fit, sats dropped significantly with lowest noted reading at 79% on RA. See below for details. Anticipate pt will be safe to d/c home and mobility will improve as flu symptoms improve. Pt will benefit from skilled PT to increase their independence and safety with mobility to allow discharge to the venue listed below.       Follow Up Recommendations No PT follow up;Supervision for mobility/OOB    Equipment Recommendations  None recommended by PT    Recommendations for Other Services       Precautions / Restrictions Precautions Precautions: None Precaution Comments: Watch O2      Mobility  Bed Mobility Overal bed mobility: Independent                Transfers Overall transfer level: Modified independent Equipment used: None             General transfer comment: Increased time. No unsteadiness or LOB noted.   Ambulation/Gait Ambulation/Gait assistance: Min assist Gait Distance (Feet): 150 Feet Assistive device: 1 person hand held assist Gait Pattern/deviations: Step-through pattern;Decreased stride length;Trunk flexed;Decreased weight shift to right;Decreased weight shift to left;Wide base  of support Gait velocity: Decreased Gait velocity interpretation: 1.31 - 2.62 ft/sec, indicative of limited community ambulator General Gait Details: Overall decreased knee flexion and pt with a waddling type gait pattern due to baseline knee deficits. Pt maintaining O2 sats consistently 88-90% on RA during gait, however stopped due to coughing fit before turning around and sats dropped to 87%. Upon return to room pt had another more severe coughing fit and sats decreased to 79%. Quick return to >90% with replacement on 2L/min supplemental O2.   Stairs            Wheelchair Mobility    Modified Rankin (Stroke Patients Only)       Balance Overall balance assessment: Needs assistance Sitting-balance support: Feet supported;No upper extremity supported Sitting balance-Leahy Scale: Normal     Standing balance support: No upper extremity supported;During functional activity Standing balance-Leahy Scale: Poor Standing balance comment: Occasional assist required for balance support                             Pertinent Vitals/Pain Pain Assessment: No/denies pain    Home Living Family/patient expects to be discharged to:: Private residence Living Arrangements: Alone Available Help at Discharge: Neighbor;Available PRN/intermittently Type of Home: Apartment Home Access: Level entry;Elevator     Home Layout: One level Home Equipment: Cane - single point;Wheelchair - manual      Prior Function Level of Independence: Independent with assistive device(s)         Comments: Occasionally uses wheelchair for community distance due  to B knee arthritis     Hand Dominance        Extremity/Trunk Assessment   Upper Extremity Assessment Upper Extremity Assessment: Overall WFL for tasks assessed    Lower Extremity Assessment Lower Extremity Assessment: Generalized weakness(Consistent with knee pain/arthritis)    Cervical / Trunk Assessment Cervical / Trunk  Assessment: Other exceptions Cervical / Trunk Exceptions: Forward head and rounded shoulder posture. Generally flexed trunk with ambulation.   Communication   Communication: No difficulties  Cognition Arousal/Alertness: Awake/alert Behavior During Therapy: WFL for tasks assessed/performed Overall Cognitive Status: Within Functional Limits for tasks assessed                                        General Comments      Exercises     Assessment/Plan    PT Assessment Patient needs continued PT services  PT Problem List Decreased strength;Decreased activity tolerance;Decreased balance;Decreased mobility;Decreased knowledge of use of DME;Decreased safety awareness;Decreased knowledge of precautions;Cardiopulmonary status limiting activity       PT Treatment Interventions DME instruction;Gait training;Stair training;Functional mobility training;Therapeutic activities;Therapeutic exercise;Neuromuscular re-education;Patient/family education    PT Goals (Current goals can be found in the Care Plan section)  Acute Rehab PT Goals Patient Stated Goal: Get better and go home PT Goal Formulation: With patient Time For Goal Achievement: 02/23/18 Potential to Achieve Goals: Good    Frequency Min 3X/week   Barriers to discharge        Co-evaluation               AM-PAC PT "6 Clicks" Mobility  Outcome Measure Help needed turning from your back to your side while in a flat bed without using bedrails?: None Help needed moving from lying on your back to sitting on the side of a flat bed without using bedrails?: None Help needed moving to and from a bed to a chair (including a wheelchair)?: A Little Help needed standing up from a chair using your arms (e.g., wheelchair or bedside chair)?: None Help needed to walk in hospital room?: A Little Help needed climbing 3-5 steps with a railing? : A Little 6 Click Score: 21    End of Session Equipment Utilized During  Treatment: Oxygen Activity Tolerance: Patient limited by fatigue Patient left: in bed;with call bell/phone within reach Nurse Communication: Mobility status PT Visit Diagnosis: Unsteadiness on feet (R26.81);Muscle weakness (generalized) (M62.81)    Time: 1030-1050 PT Time Calculation (min) (ACUTE ONLY): 20 min   Charges:   PT Evaluation $PT Eval Moderate Complexity: 1 Mod          Conni Slipper, PT, DPT Acute Rehabilitation Services Pager: [phone] Office: [phone]   Marylynn Pearson 02/16/2018, 12:01 PM

## 2018-02-16 NOTE — ED Triage Notes (Signed)
Patient c/o cough, cold and and "runny nose like water" x 3-4 days.

## 2018-02-16 NOTE — Progress Notes (Signed)
Initial Nutrition Assessment  DOCUMENTATION CODES:  Non-severe (moderate) malnutrition in context of social or environmental circumstances, Underweight  INTERVENTION:  Ensure Enlive po TID, each supplement provides 350 kcal and 20 grams of protein  MVI with minerals  Unable to truly isolate reason why pt is so undernourished   NUTRITION DIAGNOSIS:  Moderate Malnutrition related to social / environmental circumstances(?) as evidenced by moderate muscle/fat wasting.  GOAL:  Patient will meet greater than or equal to 90% of their needs  MONITOR:  PO intake  REASON FOR ASSESSMENT:  Consult Assessment of nutrition requirement/status  ASSESSMENT:  63 y/o male PMHx HTN. Presented to ED d/t cough, fever, runny nose and SOB for past 3-4 days. Pt evaluated and found + for influenza A. Admitted for management.   Pt is limited historian. From what can gather, he doesn't eat too often at home, but this is d/t formed habits as opposed to a lack of appetite. He says he doesn't cook and often doesn't have food right in front of him to eat; "some people can cook and will eat that, but I dont and dont have the food there in front of me". RD asked if finances were any concern. He did say he gets food stamps and he has limited funds for groceries. He admits funds are tight and that he "could use some help", but he did not make this sound to be a driving factor in his undernutrition. He says he has his own car, but it is broken and cant be worked on because it is cold outside. He says he gets around alright at home with a wheelchair and a rolling walker. He denies having inadequate support at home.  At baseline, he says he follows a lower salt diet. He does not take any vitamins or oral supplements.   Wt wise, he says his UBW is 150 lbs. Currently, he is 142 lbs. He can not give many details about his weight history.   At this time, he denies n/v/c/d. He is agreeable to oral supplements. He says he is  familiar with ensure, because he had that when he was in a nursing home. RD asked if this was recent. He says he was in a nursing home for 3 years following a hospitalization for dehydration so severe, it left him contracted. He says he had to relearn to walk. 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   It was difficult ascertaining why he is so underweight. Subjectively, RD feels he is experiencing more financial hardship than he wants to admit, but there are likely many other unknown influential factors.    Labs: Unremarkable Meds: Methyprednisolone  Recent Labs  Lab 02/16/18 0149  NA 137  K 3.6  CL 100  CO2 23  BUN 13  CREATININE 1.02  CALCIUM 9.0  GLUCOSE 84   NUTRITION - FOCUSED PHYSICAL EXAM:   Most Recent Value  Orbital Region  Moderate depletion  Upper Arm Region  Moderate depletion  Thoracic and Lumbar Region  Severe depletion  Buccal Region  Mild depletion  Temple Region  Mild depletion  Clavicle Bone Region  Moderate depletion  Clavicle and Acromion Bone Region  Moderate depletion  Scapular Bone Region  Unable to assess  Dorsal Hand  Mild depletion  Patellar Region  Moderate depletion  Anterior Thigh Region  Moderate depletion  Posterior Calf Region  Moderate depletion  Edema (RD Assessment)  None  Hair  Reviewed  Eyes  Reviewed  Mouth  Reviewed  Skin  Reviewed  Nails  Reviewed     Diet Order:   Diet Order            Diet Heart Room service appropriate? Yes; Fluid consistency: Thin  Diet effective now             EDUCATION NEEDS:  No education needs have been identified at this time  Skin:  Skin Assessment: Reviewed RN Assessment  Last BM:  1/23  Height:  Ht Readings from Last 1 Encounters:  02/16/18 6\' 3"  (1.905 m)   Weight:  Wt Readings from Last 1 Encounters:  02/16/18 64.5 kg   Wt Readings from Last 10 Encounters:  02/16/18 64.5 kg  05/25/11 67.1 kg  05/11/11 67.1 kg   Ideal Body Weight:  89.1 kg  BMI:  Body mass index is 17.76  kg/m.  Estimated Nutritional Needs:  Kcal:  2250-2450 kcals (35-38 kcal/kg bw) Protein:  90-105g Pro (1.4-1.6g/kg bw) Fluid:  >2 L (6330ml/kg bw)  Fred LouisNathan Wise Aja RD, LDN, CNSC Clinical Nutrition Available Tues-Sat via Pager: [phone] 02/16/2018 6:00 PM

## 2018-02-16 NOTE — Progress Notes (Signed)
Subjective: Patient admitted this morning, see detailed H&P by Dr. Julian ReilGardner   63 year old male with a history of hypertension came to ED with fever cough runny nose.  Patient also complained of shortness of breath.  No history of COPD but longstanding history of tobacco use.  Vitals:   02/16/18 1000 02/16/18 1317  BP: 121/81   Pulse: (!) 102 97  Resp: 18 16  Temp: 97.6 F (36.4 C)   SpO2: 98% 95%    On exam Patient has reduced breath sounds bilaterally.  A/P Likely acute exacerbation of COPD versus acute bronchitis Start Solu-Medrol 40 mg IV every 6 hours, DuoNebs every 6 hours  Influenza-continue Tamiflu  Hypertension-continue body pain.    Meredeth IdeGagan S Namya Voges [facility] [phone]- [phone]

## 2018-02-17 ENCOUNTER — Encounter (HOSPITAL_COMMUNITY): Payer: Self-pay

## 2018-02-17 DIAGNOSIS — E440 Moderate protein-calorie malnutrition: Secondary | ICD-10-CM

## 2018-02-17 DIAGNOSIS — J101 Influenza due to other identified influenza virus with other respiratory manifestations: Secondary | ICD-10-CM | POA: Diagnosis not present

## 2018-02-17 DIAGNOSIS — I10 Essential (primary) hypertension: Secondary | ICD-10-CM | POA: Diagnosis not present

## 2018-02-17 DIAGNOSIS — E43 Unspecified severe protein-calorie malnutrition: Secondary | ICD-10-CM

## 2018-02-17 DIAGNOSIS — J9601 Acute respiratory failure with hypoxia: Secondary | ICD-10-CM | POA: Diagnosis not present

## 2018-02-17 MED ORDER — OSELTAMIVIR PHOSPHATE 75 MG PO CAPS: 75.0000 mg | ORAL_CAPSULE | Freq: Two times a day (BID) | ORAL | 0 refills | Status: AC

## 2018-02-17 MED ORDER — GUAIFENESIN ER 600 MG PO TB12: 600.0000 mg | ORAL_TABLET | Freq: Two times a day (BID) | ORAL | 0 refills | Status: AC

## 2018-02-17 MED ORDER — ALBUTEROL SULFATE HFA 108 (90 BASE) MCG/ACT IN AERS: 2.0000 | INHALATION_SPRAY | Freq: Four times a day (QID) | RESPIRATORY_TRACT | 2 refills | Status: DC | PRN

## 2018-02-17 MED ORDER — PREDNISONE 10 MG PO TABS: ORAL_TABLET | ORAL | 0 refills | Status: DC

## 2018-02-17 NOTE — ED Provider Notes (Signed)
[facility] Provider Note   CSN: [id number] Arrival date & time: 02/16/18  0050     History   Chief Complaint Chief Complaint  Patient presents with  . Cough    HPI Fred Wise is a 63 y.o. male.  HPI 64 year old male with history of hypertension comes in with chief complaint of cough and shortness of breath.  Patient states that he has been feeling unwell for the past few days.  He has now started noticing that he is having some shortness of breath and the cough is getting worse.  Patient denies any associated fevers, but he has been having chills, diaphoresis and malaise.  Patient admits to extensive smoking history but denies any substance abuse.  Past Medical History:  Diagnosis Date  . Arthritis    knees, uses wheelchair  . Hypertension     Patient Active Problem List   Diagnosis Date Noted  . Influenza A 02/16/2018  . Hypoxia 02/16/2018  . Essential hypertension 02/16/2018    Past Surgical History:  Procedure Laterality Date  . KNEE SURGERY  1991   left  . MULTIPLE TOOTH EXTRACTIONS    . TONSILLECTOMY  1977        Home Medications    Prior to Admission medications   Medication Sig Start Date End Date Taking? Authorizing Provider  amLODipine (NORVASC) 5 MG tablet Take 5 mg by mouth daily.   Yes [provider]  meloxicam (MOBIC) 7.5 MG tablet Take 7.5 mg by mouth daily.   Yes [provider]    Family History Family History  Problem Relation Age of Onset  . Colon cancer Neg Hx   . Stomach cancer Neg Hx     Social History Social History   Tobacco Use  . Smoking status: Current Every Day Smoker    Packs/day: 0.10    Types: Cigarettes  . Smokeless tobacco: Never Used  Substance Use Topics  . Alcohol use: No  . Drug use: No     Allergies   Patient has no known allergies.   Review of Systems Review of Systems  Constitutional: Positive for activity change.  Respiratory: Positive for cough and  shortness of breath.   Cardiovascular: Negative for chest pain.  Gastrointestinal: Negative for nausea and vomiting.  Allergic/Immunologic: Negative for immunocompromised state.  All other systems reviewed and are negative.    Physical Exam Updated Vital Signs BP 128/83 (BP Location: Left Arm)   Pulse 72   Temp 97.7 F (36.5 C) (Oral)   Resp 20   Ht 6\' 3"  (1.905 m)   Wt 64.6 kg   SpO2 94%   BMI 17.80 kg/m   Physical Exam Vitals signs and nursing note reviewed.  Constitutional:      Appearance: He is well-developed.  HENT:     Head: Atraumatic.  Neck:     Musculoskeletal: Neck supple.  Cardiovascular:     Rate and Rhythm: Tachycardia present.  Pulmonary:     Effort: Pulmonary effort is normal.     Breath sounds: Wheezing present. No rhonchi.  Skin:    General: Skin is warm.  Neurological:     Mental Status: He is alert and oriented to person, place, and time.      ED Treatments / Results  Labs (all labs ordered are listed, but only abnormal results are displayed) Labs Reviewed  INFLUENZA PANEL BY PCR (TYPE A & B) - Abnormal; Notable for the following components:  Result Value   Influenza A By PCR POSITIVE (*)    All other components within normal limits  CBC WITH DIFFERENTIAL/PLATELET  BASIC METABOLIC PANEL  HIV ANTIBODY (ROUTINE TESTING W REFLEX)    EKG None  Radiology Dg Chest 2 View  Result Date: 02/16/2018 CLINICAL DATA:  Cough EXAM: CHEST - 2 VIEW COMPARISON:  Chest CT 05/01/2014 FINDINGS: The lungs are hyperinflated with diffuse interstitial prominence. No focal airspace consolidation or pulmonary edema. No pleural effusion or pneumothorax. Normal cardiomediastinal contours. IMPRESSION: COPD without acute airspace disease. Electronically Signed   By: Deatra Robinson M.D.   On: 02/16/2018 01:33    Procedures .Critical Care Performed by: Derwood Kaplan, MD Authorized by: Derwood Kaplan, MD   Critical care provider statement:    Critical  care time (minutes):  32   Critical care was necessary to treat or prevent imminent or life-threatening deterioration of the following conditions:  Respiratory failure   Critical care was time spent personally by me on the following activities:  Discussions with consultants, evaluation of patient's response to treatment, examination of patient, ordering and performing treatments and interventions, ordering and review of laboratory studies, ordering and review of radiographic studies, pulse oximetry, re-evaluation of patient's condition, obtaining history from patient or surrogate and review of old charts   (including critical care time)  Medications Ordered in ED Medications  oseltamivir (TAMIFLU) capsule 75 mg (75 mg Oral Given 02/16/18 2203)  benzonatate (TESSALON) capsule 200 mg (has no administration in time range)  enoxaparin (LOVENOX) injection 40 mg (40 mg Subcutaneous Given 02/16/18 1507)  acetaminophen (TYLENOL) tablet 650 mg (has no administration in time range)  amLODipine (NORVASC) tablet 5 mg (5 mg Oral Given 02/16/18 1002)  methylPREDNISolone sodium succinate (SOLU-MEDROL) 40 mg/mL injection 40 mg (40 mg Intravenous Given 02/17/18 0445)  feeding supplement (ENSURE ENLIVE) (ENSURE ENLIVE) liquid 237 mL (237 mLs Oral Given 02/16/18 2215)  multivitamin with minerals tablet 1 tablet (1 tablet Oral Given 02/16/18 2215)  ipratropium-albuterol (DUONEB) 0.5-2.5 (3) MG/3ML nebulizer solution 3 mL (3 mLs Nebulization Given 02/17/18 0825)  sodium chloride 0.9 % bolus 1,000 mL (0 mLs Intravenous Stopped 02/16/18 0434)  ipratropium-albuterol (DUONEB) 0.5-2.5 (3) MG/3ML nebulizer solution 3 mL (3 mLs Nebulization Given 02/16/18 0208)  benzonatate (TESSALON) capsule 200 mg (200 mg Oral Given 02/16/18 0209)  oseltamivir (TAMIFLU) capsule 75 mg (75 mg Oral Given 02/16/18 9528)     Initial Impression / Assessment and Plan / ED Course  I have reviewed the triage vital signs and the nursing  notes.  Pertinent labs & imaging results that were available during my care of the patient were reviewed by me and considered in my medical decision making (see chart for details).     63 year old male comes in with chief complaint of cough and shortness of breath.  On exam he has mild wheezing.  He is also noted to be tachypneic and he is unable to complete sentences without having to catch his breath. Patient's flu test is positive.  He does not have any underlying lung disease that he is aware of, however x-ray does suggest that he might have COPD.  Patient's O2 sats are in the low 90s.  Upon ambulation his oxygen saturation drops in the 80s.  We will admit him for influenza treatment.  Final Clinical Impressions(s) / ED Diagnoses   Final diagnoses:  Influenza  Acute respiratory failure with hypoxia [facility])    ED Discharge Orders    None  Derwood KaplanNanavati, Deshante Cassell, MD 02/17/18 [phone]

## 2018-02-17 NOTE — Progress Notes (Signed)
OT Evaluation  Pt close to baseline level of functioning. discussed recommendation of pt getting a shower chair from the [facility]. Pt verbalized understanding. No further OT needed.     02/17/18 1300  OT Visit Information  Last OT Received On 02/17/18  Assistance Needed +1  History of Present Illness Pt is a 63 y/o male who presents with multiple day history of cough and cold symptoms. Pt was found to be Flu type A positive, with hypoxia noted on ambulation. PMH significant for L knee surgery 1991, Bilateral knee arthritis, HTN.   Precautions  Precautions None  Precaution Comments Watch O2  Home Living  Family/patient expects to be discharged to: Private residence  Living Arrangements Alone  Available Help at Discharge Neighbor;Available PRN/intermittently  Type of Home Apartment  Home Access Level entry;Elevator  Home Layout One level  Bathroom Shower/Tub Programmer, multimediaTub/shower unit  Bathroom Toilet Standard  Bathroom Accessibility Yes  How Accessible Accessible via walker  Home Equipment [place] - single point;Wheelchair - manual  Additional Comments Completed ADL tasks and mobility with SpO2 @ 93. ote dyspnea 2/4. Pt staes it is becuase of his "cough"  Prior Function  Level of Independence Independent with assistive device(s)  Comments Occasionally uses wheelchair for community distance due to B knee arthritis  Communication  Communication No difficulties  Pain Assessment  Pain Assessment 0-10  Pain Score 6  Pain Location B feet  Pain Descriptors / Indicators  (hurt because of frostbite)  Pain Intervention(s) Limited activity within patient's tolerance  Cognition  Arousal/Alertness Awake/alert  Behavior During Therapy WFL for tasks assessed/performed  Overall Cognitive Status Within Functional Limits for tasks assessed (deficits - most likely baseline)  Upper Extremity Assessment  Upper Extremity Assessment Overall WFL for tasks assessed  Lower Extremity Assessment  Lower Extremity  Assessment Defer to PT evaluation  Cervical / Trunk Assessment  Cervical / Trunk Assessment Other exceptions  Cervical / Trunk Exceptions Forward head and rounded shoulder posture. Generally flexed trunk with ambulation.   ADL  Overall ADL's  At baseline  Bed Mobility  Overal bed mobility Independent  Transfers  Overall transfer level Modified independent  Equipment used None  General transfer comment Increased time. No unsteadiness or LOB noted.   Balance  Overall balance assessment Needs assistance  Sitting-balance support Feet supported;No upper extremity supported  Sitting balance-Leahy Scale Normal  Standing balance support No upper extremity supported;During functional activity  Standing balance-Leahy Scale Poor  Standing balance comment Occasional assist required for balance support  OT - End of Session  Activity Tolerance Patient tolerated treatment well  Patient left in bed;with call bell/phone within reach  Nurse Communication Mobility status;Other (comment) (DC recommendations)  OT Assessment  OT Recommendation/Assessment Patient does not need any further OT services  OT Visit Diagnosis Unsteadiness on feet (R26.81)  OT Problem List Decreased activity tolerance  AM-PAC OT "6 Clicks" Daily Activity Outcome Measure (Version 2)  Help from another person eating meals? 4  Help from another person taking care of personal grooming? 4  Help from another person toileting, which includes using toliet, bedpan, or urinal? 4  Help from another person bathing (including washing, rinsing, drying)? 4  Help from another person to put on and taking off regular upper body clothing? 4  Help from another person to put on and taking off regular lower body clothing? 4  6 Click Score 24  OT Recommendation  Follow Up Recommendations No OT follow up;Supervision - Intermittent  OT Equipment Other (comment) (showerchair from [facility])  Acute Rehab OT Goals  Patient Stated Goal Get better and go  home  OT Goal Formulation All assessment and education complete, DC therapy  OT Time Calculation  OT Start Time (ACUTE ONLY) 1310  OT Stop Time (ACUTE ONLY) 1330  OT Time Calculation (min) 20 min  OT General Charges  $OT Visit 1 Visit  OT Evaluation  $OT Eval Low Complexity 1 Low  Written Expression  Dominant Hand Right  Luisa Dago, OT/L   Acute OT Clinical Specialist Acute Rehabilitation Services Pager [phone] Office [phone]

## 2018-02-17 NOTE — Progress Notes (Signed)
During ambulation on room air, patients oxygen saturation maintained between 93%-98% with no complaints of shortness of breath. MD notified and made aware. Will continue to monitor.

## 2018-02-17 NOTE — Discharge Summary (Signed)
Physician Discharge Summary  Judeth Hornlfred Aki [id number] DOB: Mar 12, 1955 DOA: 02/16/2018  PCP: Gilda CreasePavelock, Richard M, MD  Admit date: 02/16/2018 Discharge date: 02/17/2018  Time spent: 45 minutes  Recommendations for Outpatient Follow-up:  1. Follow-up PCP in 2 weeks. 2. Patient will need outpatient PFTs  Discharge Diagnoses:  Principal Problem:   Influenza A Active Problems:   Hypoxia   Essential hypertension   Malnutrition of moderate degree   Discharge Condition: Stable  Diet recommendation: Heart healthy diet  Filed Weights   02/16/18 0551 02/16/18 2127  Weight: 64.5 kg 64.6 kg    History of present illness:  63 year old male with a history of hypertension came to ED with fever cough runny nose.  Patient also complained of shortness of breath.  No history of COPD but longstanding history of tobacco use  Hospital Course:   Acute bronchitis versus COPD exacerbation-improved, not requiring oxygen, patient has no formal diagnosis of COPD, was started on Solu-Medrol every 6 hours, duo nebs.  Breathing has improved.  Will discharge on prednisone taper, Mucinex, PRN albuterol.  He will need outpatient PFTs to confirm COPD.  Influenza A-patient was positive for influenza A, started on Tamiflu.  Will discharge on Tamiflu 75 mg p.o. twice daily for 4 more days.  Hypertension-continue amlodipine  Procedures:  Consultations:    Discharge Exam: Vitals:   02/17/18 0826 02/17/18 0903  BP:  (!) 153/80  Pulse: 72 95  Resp: 20 18  Temp:  98 F (36.7 C)  SpO2: 94% 93%    General: Appears in no acute distress Cardiovascular: S1-S2, regular  Respiratory: Clear to auscultation bilaterally, no wheezing or crackles.  Discharge Instructions   Discharge Instructions    Diet - low sodium heart healthy   Complete by:  As directed    Increase activity slowly   Complete by:  As directed      Allergies as of 02/17/2018   No Known Allergies     Medication List    TAKE  these medications   albuterol 108 (90 Base) MCG/ACT inhaler Commonly known as:  PROVENTIL HFA;VENTOLIN HFA Inhale 2 puffs into the lungs every 6 (six) hours as needed for wheezing or shortness of breath.   amLODipine 5 MG tablet Commonly known as:  NORVASC Take 5 mg by mouth daily.   guaiFENesin 600 MG 12 hr tablet Commonly known as:  MUCINEX Take 1 tablet (600 mg total) by mouth 2 (two) times daily for 5 days.   meloxicam 7.5 MG tablet Commonly known as:  MOBIC Take 7.5 mg by mouth daily.   oseltamivir 75 MG capsule Commonly known as:  TAMIFLU Take 1 capsule (75 mg total) by mouth 2 (two) times daily for 4 days.   predniSONE 10 MG tablet Commonly known as:  DELTASONE Prednisone 40 mg po daily x 1 day then Prednisone 30 mg po daily x 1 day then Prednisone 20 mg po daily x 1 day then Prednisone 10 mg daily x 1 day then stop...      No Known Allergies    The results of significant diagnostics from this hospitalization (including imaging, microbiology, ancillary and laboratory) are listed below for reference.    Significant Diagnostic Studies: Dg Chest 2 View  Result Date: 02/16/2018 CLINICAL DATA:  Cough EXAM: CHEST - 2 VIEW COMPARISON:  Chest CT 05/01/2014 FINDINGS: The lungs are hyperinflated with diffuse interstitial prominence. No focal airspace consolidation or pulmonary edema. No pleural effusion or pneumothorax. Normal cardiomediastinal contours. IMPRESSION: COPD without acute airspace disease.  Electronically Signed   By: Deatra RobinsonKevin  Herman M.D.   On: 02/16/2018 01:33     Labs: Basic Metabolic Panel: Recent Labs  Lab 02/16/18 0149  NA 137  K 3.6  CL 100  CO2 23  GLUCOSE 84  BUN 13  CREATININE 1.02  CALCIUM 9.0    Recent Labs  Lab 02/16/18 0149  WBC 6.3  NEUTROABS 4.5  HGB 15.8  HCT 48.6  MCV 90.8  PLT 192    Signed:  Meredeth IdeGagan S  MD.  [facility] 02/17/2018, 10:35 AM

## 2018-02-17 NOTE — Progress Notes (Signed)
Fred Wise to be D/C'd Home per MD order.  Discussed prescriptions and follow up appointments with the patient. Prescriptions given to patient, medication list explained in detail. Pt verbalized understanding.  Allergies as of 02/17/2018   No Known Allergies     Medication List    TAKE these medications   albuterol 108 (90 Base) MCG/ACT inhaler Commonly known as:  PROVENTIL HFA;VENTOLIN HFA Inhale 2 puffs into the lungs every 6 (six) hours as needed for wheezing or shortness of breath.   amLODipine 5 MG tablet Commonly known as:  NORVASC Take 5 mg by mouth daily.   guaiFENesin 600 MG 12 hr tablet Commonly known as:  MUCINEX Take 1 tablet (600 mg total) by mouth 2 (two) times daily for 5 days.   meloxicam 7.5 MG tablet Commonly known as:  MOBIC Take 7.5 mg by mouth daily.   oseltamivir 75 MG capsule Commonly known as:  TAMIFLU Take 1 capsule (75 mg total) by mouth 2 (two) times daily for 4 days.   predniSONE 10 MG tablet Commonly known as:  DELTASONE Prednisone 40 mg po daily x 1 day then Prednisone 30 mg po daily x 1 day then Prednisone 20 mg po daily x 1 day then Prednisone 10 mg daily x 1 day then stop...       Vitals:   02/17/18 0826 02/17/18 0903  BP:  (!) 153/80  Pulse: 72 95  Resp: 20 18  Temp:  98 F (36.7 C)  SpO2: 94% 93%    Skin clean, dry and intact without evidence of skin break down, no evidence of skin tears noted. IV catheter discontinued intact. Site without signs and symptoms of complications. Dressing and pressure applied. Pt denies pain at this time. No complaints noted.  An After Visit Summary was printed and given to the patient. Patient escorted via WC, and D/C home via private auto.  Selina Cooley BSN, RN

## 2021-07-18 ENCOUNTER — Encounter: Payer: Self-pay | Admitting: Gastroenterology

## 2021-10-10 ENCOUNTER — Encounter: Payer: Self-pay | Admitting: Gastroenterology

## 2021-10-20 ENCOUNTER — Encounter: Payer: Self-pay | Admitting: Gastroenterology

## 2021-10-20 ENCOUNTER — Ambulatory Visit (AMBULATORY_SURGERY_CENTER): Payer: Self-pay

## 2021-10-20 VITALS — Ht 75.0 in | Wt 129.0 lb

## 2021-10-20 DIAGNOSIS — Z1211 Encounter for screening for malignant neoplasm of colon: Secondary | ICD-10-CM

## 2021-10-20 MED ORDER — NA SULFATE-K SULFATE-MG SULF 17.5-3.13-1.6 GM/177ML PO SOLN: 1.0000 | ORAL | 0 refills | Status: DC

## 2021-10-20 NOTE — Progress Notes (Signed)
No egg or soy allergy known to patient  No issues known to pt with past sedation with any surgeries or procedures Patient denies ever being told they had issues or difficulty with intubation  No FH of Malignant Hyperthermia Pt is not on diet pills Pt is not on  home 02  Pt is not on blood thinners  Pt denies issues with constipation  No A fib or A flutter Have any cardiac testing pending--denied Pt instructed to use [employer] or GoodRx for a price reduction on prep   Pt utilizes wheelchair, but stands and ambulates to weigh. Able to walk short distances.

## 2021-10-29 ENCOUNTER — Encounter: Payer: Self-pay | Admitting: Certified Registered Nurse Anesthetist

## 2021-10-31 ENCOUNTER — Encounter: Payer: Self-pay | Admitting: Gastroenterology

## 2021-10-31 ENCOUNTER — Ambulatory Visit (AMBULATORY_SURGERY_CENTER): Payer: Medicare Other | Admitting: Gastroenterology

## 2021-10-31 VITALS — BP 127/57 | HR 70 | Temp 95.5°F | Resp 20 | Ht 75.0 in | Wt 129.0 lb

## 2021-10-31 DIAGNOSIS — Z1211 Encounter for screening for malignant neoplasm of colon: Secondary | ICD-10-CM

## 2021-10-31 MED ORDER — SODIUM CHLORIDE 0.9 % IV SOLN: 500.0000 mL | Freq: Once | INTRAVENOUS | Status: DC

## 2021-10-31 NOTE — Patient Instructions (Signed)
Please read handouts provided. Continue present medications. Return to GI office as needed. Resume previous diet.  YOU HAD AN ENDOSCOPIC PROCEDURE TODAY AT [facility]:   Refer to the procedure report that was given to you for any specific questions about what was found during the examination.  If the procedure report does not answer your questions, please call your gastroenterologist to clarify.  If you requested that your care partner not be given the details of your procedure findings, then the procedure report has been included in a sealed envelope for you to review at your convenience later.  YOU SHOULD EXPECT: Some feelings of bloating in the abdomen. Passage of more gas than usual.  Walking can help get rid of the air that was put into your GI tract during the procedure and reduce the bloating. If you had a lower endoscopy (such as a colonoscopy or flexible sigmoidoscopy) you may notice spotting of blood in your stool or on the toilet paper. If you underwent a bowel prep for your procedure, you may not have a normal bowel movement for a few days.  Please Note:  You might notice some irritation and congestion in your nose or some drainage.  This is from the oxygen used during your procedure.  There is no need for concern and it should clear up in a day or so.  SYMPTOMS TO REPORT IMMEDIATELY:  Following lower endoscopy (colonoscopy or flexible sigmoidoscopy):  Excessive amounts of blood in the stool  Significant tenderness or worsening of abdominal pains  Swelling of the abdomen that is new, acute  Fever of 100F or higher.  For urgent or emergent issues, a gastroenterologist can be reached at any hour by calling [phone]. Do not use MyChart messaging for urgent concerns.    DIET:  We do recommend a small meal at first, but then you may proceed to your regular diet.  Drink plenty of fluids but you should avoid alcoholic beverages for 24 hours.  ACTIVITY:  You  should plan to take it easy for the rest of today and you should NOT DRIVE or use heavy machinery until tomorrow (because of the sedation medicines used during the test).    FOLLOW UP: Our staff will call the number listed on your records the next business day following your procedure.  We will call around 7:15- 8:00 am to check on you and address any questions or concerns that you may have regarding the information given to you following your procedure. If we do not reach you, we will leave a message.     If any biopsies were taken you will be contacted by phone or by letter within the next 1-3 weeks.  Please call us at [phone] if you have not heard about the biopsies in 3 weeks.    SIGNATURES/CONFIDENTIALITY: You and/or your care partner have signed paperwork which will be entered into your electronic medical record.  These signatures attest to the fact that that the information above on your After Visit Summary has been reviewed and is understood.  Full responsibility of the confidentiality of this discharge information lies with you and/or your care-partner.

## 2021-10-31 NOTE — Progress Notes (Signed)
GASTROENTEROLOGY PROCEDURE H&P NOTE   Primary Care Physician: Javier Docker, MD    Reason for Procedure:  Colon Cancer screening  Plan:    Colonoscopy  Patient is appropriate for endoscopic procedure(s) in the ambulatory ([place]) setting.  The nature of the procedure, as well as the risks, benefits, and alternatives were carefully and thoroughly reviewed with the patient. Ample time for discussion and questions allowed. The patient understood, was satisfied, and agreed to proceed.     HPI: Fred Wise is a 66 y.o. male who presents for colonoscopy for ongoing Colon Cancer screening.  No active GI symptoms.  No known family history of colon cancer or related malignancy.  Patient is otherwise without complaints or active issues today.  Last colonoscopy was 05/2011 and normal.   Past Medical History:  Diagnosis Date   Arthritis    knees, uses wheelchair   Hypertension     Past Surgical History:  Procedure Laterality Date   COLONOSCOPY  05/25/2011   KNEE SURGERY  01/23/1989   left   MULTIPLE TOOTH EXTRACTIONS     TONSILLECTOMY  01/24/1975    Prior to Admission medications   Medication Sig Start Date End Date Taking? Authorizing Provider  amLODipine (NORVASC) 5 MG tablet Take 5 mg by mouth daily.   Yes [provider]  albuterol (PROVENTIL HFA;VENTOLIN HFA) 108 (90 Base) MCG/ACT inhaler Inhale 2 puffs into the lungs every 6 (six) hours as needed for wheezing or shortness of breath. Patient not taking: Reported on 10/20/2021 02/17/18   Oswald Hillock, MD  meloxicam (MOBIC) 7.5 MG tablet Take 7.5 mg by mouth daily.    [provider]  predniSONE (DELTASONE) 10 MG tablet Prednisone 40 mg po daily x 1 day then Prednisone 30 mg po daily x 1 day then Prednisone 20 mg po daily x 1 day then Prednisone 10 mg daily x 1 day then stop... Patient not taking: Reported on 10/20/2021 02/17/18   Oswald Hillock, MD    Current Outpatient Medications  Medication Sig  Dispense Refill   amLODipine (NORVASC) 5 MG tablet Take 5 mg by mouth daily.     albuterol (PROVENTIL HFA;VENTOLIN HFA) 108 (90 Base) MCG/ACT inhaler Inhale 2 puffs into the lungs every 6 (six) hours as needed for wheezing or shortness of breath. (Patient not taking: Reported on 10/20/2021) 1 Inhaler 2   meloxicam (MOBIC) 7.5 MG tablet Take 7.5 mg by mouth daily.     predniSONE (DELTASONE) 10 MG tablet Prednisone 40 mg po daily x 1 day then Prednisone 30 mg po daily x 1 day then Prednisone 20 mg po daily x 1 day then Prednisone 10 mg daily x 1 day then stop... (Patient not taking: Reported on 10/20/2021) 10 tablet 0   Current Facility-Administered Medications  Medication Dose Route Frequency Provider Last Rate Last Admin   0.9 %  sodium chloride infusion  500 mL Intravenous Once Draco Malczewski V, DO        Allergies as of 10/31/2021   (No Known Allergies)    Family History  Problem Relation Age of Onset   Colon cancer Neg Hx    Stomach cancer Neg Hx    Esophageal cancer Neg Hx    Rectal cancer Neg Hx     Social History   Socioeconomic History   Marital status: Single    Spouse name: Not on file   Number of children: Not on file   Years of education: Not on file  Highest education level: Not on file  Occupational History   Not on file  Tobacco Use   Smoking status: Every Day    Packs/day: 0.10    Types: Cigarettes   Smokeless tobacco: Never  Vaping Use   Vaping Use: Never used  Substance and Sexual Activity   Alcohol use: No   Drug use: No   Sexual activity: Not on file  Other Topics Concern   Not on file  Social History Narrative   Not on file   Social Determinants of Health   Financial Resource Strain: Not on file  Food Insecurity: Not on file  Transportation Needs: Not on file  Physical Activity: Not on file  Stress: Not on file  Social Connections: Not on file  Intimate Partner Violence: Not on file    Physical Exam: Vital signs in last 24  hours: @BP  137/73   Pulse 86   Temp (!) 95.5 F (35.3 C) (Temporal)   Ht 6\' 3"  (1.905 m)   Wt 129 lb (58.5 kg)   SpO2 99%   BMI 16.12 kg/m  GEN: NAD EYE: Sclerae anicteric ENT: MMM CV: Non-tachycardic Pulm: CTA b/l GI: Soft, NT/ND NEURO:  Alert & Oriented x 3   Gerrit Heck, DO [facility]   10/31/2021 2:07 PM

## 2021-10-31 NOTE — Op Note (Signed)
[place] Patient Name: Fred Wise Procedure Date: 10/31/2021 2:13 PM MRN: [id number] Endoscopist: Gerrit Heck , MD Age: 66 Referring MD:  Date of Birth: 01-31-1955 Gender: Male Account #: [id number] Procedure:                Colonoscopy Indications:              Screening for colorectal malignant neoplasm (last                            colonoscopy was 10 years ago)                           Last colonoscopy was 05/2011 and normal. Medicines:                Monitored Anesthesia Care Procedure:                Pre-Anesthesia Assessment:                           - Prior to the procedure, a History and Physical                            was performed, and patient medications and                            allergies were reviewed. The patient's tolerance of                            previous anesthesia was also reviewed. The risks                            and benefits of the procedure and the sedation                            options and risks were discussed with the patient.                            All questions were answered, and informed consent                            was obtained. Prior Anticoagulants: The patient has                            taken no previous anticoagulant or antiplatelet                            agents. ASA Grade Assessment: II - A patient with                            mild systemic disease. After reviewing the risks                            and benefits, the patient was deemed in  satisfactory condition to undergo the procedure.                           After obtaining informed consent, the colonoscope                            was passed under direct vision. Throughout the                            procedure, the patient's blood pressure, pulse, and                            oxygen saturations were monitored continuously. The                            Olympus PCF-H190DL ([id number])  Colonoscope was                            introduced through the anus and advanced to the the                            terminal ileum. The colonoscopy was performed                            without difficulty. The patient tolerated the                            procedure well. The quality of the bowel                            preparation was good. The terminal ileum, ileocecal                            valve, appendiceal orifice, and rectum were                            photographed. Scope In: 2:17:01 PM Scope Out: 2:34:30 PM Scope Withdrawal Time: 0 hours 8 minutes 47 seconds  Total Procedure Duration: 0 hours 17 minutes 29 seconds  Findings:                 The perianal and digital rectal examinations were                            normal.                           The entire colon appeared normal.                           The retroflexed view of the distal rectum and anal                            verge was normal and showed no anal or rectal  abnormalities.                           The terminal ileum appeared normal. Complications:            No immediate complications. Estimated Blood Loss:     Estimated blood loss: none. Impression:               - The entire examined colon is normal.                           - The distal rectum and anal verge are normal on                            retroflexion view.                           - The examined portion of the ileum was normal.                           - No specimens collected. Recommendation:           - Patient has a contact number available for                            emergencies. The signs and symptoms of potential                            delayed complications were discussed with the                            patient. Return to normal activities tomorrow.                            Written discharge instructions were provided to the                            patient.                            - Resume previous diet.                           - Continue present medications.                           - Repeat colonoscopy is not recommended due to                            current age (86 years or older) for screening                            purposes.                           - Return to GI office PRN. Doristine Locks, MD 10/31/2021 2:40:38 PM

## 2021-10-31 NOTE — Progress Notes (Signed)
Report given to PACU, vss 

## 2021-10-31 NOTE — Progress Notes (Signed)
VS completed by DT.  Pt's states no medical or surgical changes since previsit or office visit.  Had some bananas with corn puff cereal at 1500 yesterday 10/30/21 with water - pt stated that he didn't have any milk. But pt states that he is clear and yellow with no solid pieces when using the bathroom.

## 2021-11-01 ENCOUNTER — Telehealth: Payer: Self-pay | Admitting: *Deleted

## 2021-11-01 NOTE — Telephone Encounter (Signed)
  Follow up Call-     10/31/2021    1:40 PM  Call back number  Post procedure Call Back phone  # [phone]  Permission to leave phone message Yes     Patient questions:  Do you have a fever, pain , or abdominal swelling? No. Pain Score  0 *  Have you tolerated food without any problems? Yes.    Have you been able to return to your normal activities? Yes.    Do you have any questions about your discharge instructions: Diet   No. Medications  No. Follow up visit  No.  Do you have questions or concerns about your Care? No.  Actions: * If pain score is 4 or above: No action needed, pain <4.

## 2023-08-16 ENCOUNTER — Inpatient Hospital Stay (HOSPITAL_COMMUNITY)

## 2023-08-16 ENCOUNTER — Inpatient Hospital Stay (HOSPITAL_COMMUNITY)
Admission: EM | Admit: 2023-08-16 | Discharge: 2023-08-29 | DRG: 166 | Disposition: A | Discharge status: Skilled Nursing Facility | Attending: Internal Medicine | Admitting: Internal Medicine

## 2023-08-16 ENCOUNTER — Emergency Department (HOSPITAL_COMMUNITY)

## 2023-08-16 ENCOUNTER — Encounter (HOSPITAL_COMMUNITY): Payer: Self-pay | Admitting: Internal Medicine

## 2023-08-16 DIAGNOSIS — J189 Pneumonia, unspecified organism: Principal | ICD-10-CM | POA: Diagnosis present

## 2023-08-16 DIAGNOSIS — R634 Abnormal weight loss: Secondary | ICD-10-CM | POA: Diagnosis present

## 2023-08-16 DIAGNOSIS — E43 Unspecified severe protein-calorie malnutrition: Secondary | ICD-10-CM | POA: Diagnosis present

## 2023-08-16 DIAGNOSIS — E871 Hypo-osmolality and hyponatremia: Secondary | ICD-10-CM | POA: Diagnosis present

## 2023-08-16 DIAGNOSIS — Z515 Encounter for palliative care: Secondary | ICD-10-CM | POA: Diagnosis not present

## 2023-08-16 DIAGNOSIS — E46 Unspecified protein-calorie malnutrition: Secondary | ICD-10-CM | POA: Diagnosis not present

## 2023-08-16 DIAGNOSIS — Z79899 Other long term (current) drug therapy: Secondary | ICD-10-CM | POA: Diagnosis not present

## 2023-08-16 DIAGNOSIS — D75839 Thrombocytosis, unspecified: Secondary | ICD-10-CM | POA: Diagnosis present

## 2023-08-16 DIAGNOSIS — R54 Age-related physical debility: Secondary | ICD-10-CM | POA: Diagnosis present

## 2023-08-16 DIAGNOSIS — J449 Chronic obstructive pulmonary disease, unspecified: Secondary | ICD-10-CM

## 2023-08-16 DIAGNOSIS — Z9185 Personal history of military service: Secondary | ICD-10-CM

## 2023-08-16 DIAGNOSIS — J939 Pneumothorax, unspecified: Secondary | ICD-10-CM | POA: Diagnosis not present

## 2023-08-16 DIAGNOSIS — J948 Other specified pleural conditions: Secondary | ICD-10-CM | POA: Diagnosis not present

## 2023-08-16 DIAGNOSIS — G9341 Metabolic encephalopathy: Secondary | ICD-10-CM | POA: Diagnosis present

## 2023-08-16 DIAGNOSIS — J918 Pleural effusion in other conditions classified elsewhere: Secondary | ICD-10-CM | POA: Diagnosis not present

## 2023-08-16 DIAGNOSIS — J869 Pyothorax without fistula: Secondary | ICD-10-CM | POA: Diagnosis not present

## 2023-08-16 DIAGNOSIS — E876 Hypokalemia: Secondary | ICD-10-CM | POA: Diagnosis present

## 2023-08-16 DIAGNOSIS — R636 Underweight: Secondary | ICD-10-CM | POA: Diagnosis not present

## 2023-08-16 DIAGNOSIS — R0602 Shortness of breath: Secondary | ICD-10-CM | POA: Diagnosis present

## 2023-08-16 DIAGNOSIS — R1310 Dysphagia, unspecified: Secondary | ICD-10-CM | POA: Diagnosis present

## 2023-08-16 DIAGNOSIS — Z720 Tobacco use: Secondary | ICD-10-CM | POA: Diagnosis present

## 2023-08-16 DIAGNOSIS — J439 Emphysema, unspecified: Secondary | ICD-10-CM | POA: Diagnosis present

## 2023-08-16 DIAGNOSIS — D72829 Elevated white blood cell count, unspecified: Secondary | ICD-10-CM | POA: Diagnosis present

## 2023-08-16 DIAGNOSIS — J90 Pleural effusion, not elsewhere classified: Secondary | ICD-10-CM | POA: Diagnosis present

## 2023-08-16 DIAGNOSIS — R64 Cachexia: Secondary | ICD-10-CM | POA: Diagnosis present

## 2023-08-16 DIAGNOSIS — F17210 Nicotine dependence, cigarettes, uncomplicated: Secondary | ICD-10-CM | POA: Diagnosis present

## 2023-08-16 DIAGNOSIS — R5383 Other fatigue: Secondary | ICD-10-CM | POA: Diagnosis not present

## 2023-08-16 DIAGNOSIS — J910 Malignant pleural effusion: Secondary | ICD-10-CM | POA: Diagnosis present

## 2023-08-16 DIAGNOSIS — G9340 Encephalopathy, unspecified: Secondary | ICD-10-CM | POA: Diagnosis not present

## 2023-08-16 DIAGNOSIS — I10 Essential (primary) hypertension: Secondary | ICD-10-CM | POA: Diagnosis present

## 2023-08-16 DIAGNOSIS — C3431 Malignant neoplasm of lower lobe, right bronchus or lung: Secondary | ICD-10-CM | POA: Diagnosis present

## 2023-08-16 DIAGNOSIS — Z791 Long term (current) use of non-steroidal anti-inflammatories (NSAID): Secondary | ICD-10-CM

## 2023-08-16 DIAGNOSIS — M1712 Unilateral primary osteoarthritis, left knee: Secondary | ICD-10-CM | POA: Diagnosis present

## 2023-08-16 DIAGNOSIS — K5900 Constipation, unspecified: Secondary | ICD-10-CM | POA: Diagnosis not present

## 2023-08-16 DIAGNOSIS — J440 Chronic obstructive pulmonary disease with acute lower respiratory infection: Secondary | ICD-10-CM | POA: Diagnosis present

## 2023-08-16 DIAGNOSIS — Z681 Body mass index (BMI) 19 or less, adult: Secondary | ICD-10-CM

## 2023-08-16 DIAGNOSIS — C3491 Malignant neoplasm of unspecified part of right bronchus or lung: Secondary | ICD-10-CM

## 2023-08-16 DIAGNOSIS — R627 Adult failure to thrive: Secondary | ICD-10-CM | POA: Diagnosis present

## 2023-08-16 DIAGNOSIS — C787 Secondary malignant neoplasm of liver and intrahepatic bile duct: Secondary | ICD-10-CM | POA: Diagnosis present

## 2023-08-16 DIAGNOSIS — D649 Anemia, unspecified: Secondary | ICD-10-CM | POA: Diagnosis not present

## 2023-08-16 DIAGNOSIS — D72823 Leukemoid reaction: Secondary | ICD-10-CM | POA: Diagnosis not present

## 2023-08-16 DIAGNOSIS — Z5941 Food insecurity: Secondary | ICD-10-CM

## 2023-08-16 DIAGNOSIS — Z1152 Encounter for screening for COVID-19: Secondary | ICD-10-CM

## 2023-08-16 LAB — TROPONIN I (HIGH SENSITIVITY)
Troponin I (High Sensitivity): 15 ng/L (ref <18)
Troponin I (High Sensitivity): 11 ng/L (ref <18)

## 2023-08-16 LAB — COMPREHENSIVE METABOLIC PANEL WITH GFR
ALT: 14 U/L (ref 0–44)
AST: 22 U/L (ref 15–41)
Albumin: 2.1 g/dL — ABNORMAL LOW (ref 3.5–5.0)
Alkaline Phosphatase: 58 U/L (ref 38–126)
Anion gap: 14 (ref 5–15)
BUN: 16 mg/dL (ref 8–23)
CO2: 23 mmol/L (ref 22–32)
Calcium: 8.9 mg/dL (ref 8.9–10.3)
Chloride: 99 mmol/L (ref 98–111)
Creatinine, Ser: 0.75 mg/dL (ref 0.61–1.24)
GFR, Estimated: >60 mL/min (ref >60)
Glucose, Bld: 84 mg/dL (ref 70–99)
Potassium: 4.5 mmol/L (ref 3.5–5.1)
Sodium: 136 mmol/L (ref 135–145)
Total Bilirubin: 1.3 mg/dL — ABNORMAL HIGH (ref 0.0–1.2)
Total Protein: 6 g/dL — ABNORMAL LOW (ref 6.5–8.1)

## 2023-08-16 LAB — CBC WITH DIFFERENTIAL/PLATELET
Abs Immature Granulocytes: 0.09 K/uL — ABNORMAL HIGH (ref 0.00–0.07)
Basophils Absolute: 0.1 K/uL (ref 0.0–0.1)
Basophils Relative: 0 %
Eosinophils Absolute: 0.4 K/uL (ref 0.0–0.5)
Eosinophils Relative: 4 %
HCT: 41.7 % (ref 39.0–52.0)
Hemoglobin: 13.6 g/dL (ref 13.0–17.0)
Immature Granulocytes: 1 %
Lymphocytes Relative: 11 %
Lymphs Abs: 1.2 K/uL (ref 0.7–4.0)
MCH: 28.2 pg (ref 26.0–34.0)
MCHC: 32.6 g/dL (ref 30.0–36.0)
MCV: 86.3 fL (ref 80.0–100.0)
Monocytes Absolute: 0.9 K/uL (ref 0.1–1.0)
Monocytes Relative: 8 %
Neutro Abs: 9 K/uL — ABNORMAL HIGH (ref 1.7–7.7)
Neutrophils Relative %: 76 %
Platelets: 456 K/uL — ABNORMAL HIGH (ref 150–400)
RBC: 4.83 MIL/uL (ref 4.22–5.81)
RDW: 15.2 % (ref 11.5–15.5)
WBC: 11.7 K/uL — ABNORMAL HIGH (ref 4.0–10.5)
nRBC: 0 % (ref 0.0–0.2)

## 2023-08-16 LAB — TSH: TSH: 3.186 u[IU]/mL (ref 0.350–4.500)

## 2023-08-16 LAB — BRAIN NATRIURETIC PEPTIDE: B Natriuretic Peptide: 69 pg/mL (ref 0.0–100.0)

## 2023-08-16 LAB — RESP PANEL BY RT-PCR (RSV, FLU A&B, COVID)  RVPGX2
Influenza A by PCR: NEGATIVE
Influenza B by PCR: NEGATIVE
Resp Syncytial Virus by PCR: NEGATIVE
SARS Coronavirus 2 by RT PCR: NEGATIVE

## 2023-08-16 LAB — SEDIMENTATION RATE: Sed Rate: 59 mm/h — ABNORMAL HIGH (ref 0–16)

## 2023-08-16 LAB — STREP PNEUMONIAE URINARY ANTIGEN: Strep Pneumo Urinary Antigen: NEGATIVE

## 2023-08-16 LAB — HIV ANTIBODY (ROUTINE TESTING W REFLEX): HIV Screen 4th Generation wRfx: NONREACTIVE

## 2023-08-16 LAB — C-REACTIVE PROTEIN: CRP: 14.1 mg/dL — ABNORMAL HIGH (ref <1.0)

## 2023-08-16 LAB — PROCALCITONIN: Procalcitonin: 0.57 ng/mL

## 2023-08-16 MED ORDER — IPRATROPIUM-ALBUTEROL 0.5-2.5 (3) MG/3ML IN SOLN
3.0000 mL | Freq: Once | RESPIRATORY_TRACT | Status: AC
  Administered 2023-08-16: 3 mL via RESPIRATORY_TRACT
  Filled 2023-08-16: qty 3

## 2023-08-16 MED ORDER — IOHEXOL 350 MG/ML SOLN
75.0000 mL | Freq: Once | INTRAVENOUS | Status: AC | PRN
  Administered 2023-08-16: 75 mL via INTRAVENOUS

## 2023-08-16 MED ORDER — SODIUM CHLORIDE 0.9 % IV SOLN
2.0000 g | INTRAVENOUS | Status: AC
  Administered 2023-08-17 – 2023-08-20 (×4): 2 g via INTRAVENOUS
  Filled 2023-08-16 (×4): qty 20

## 2023-08-16 MED ORDER — MAGNESIUM SULFATE 2 GM/50ML IV SOLN
2.0000 g | Freq: Once | INTRAVENOUS | Status: AC
  Administered 2023-08-16: 2 g via INTRAVENOUS
  Filled 2023-08-16: qty 50

## 2023-08-16 MED ORDER — ACETAMINOPHEN 650 MG RE SUPP: 650.0000 mg | Freq: Four times a day (QID) | RECTAL | Status: DC | PRN

## 2023-08-16 MED ORDER — GUAIFENESIN ER 600 MG PO TB12
600.0000 mg | ORAL_TABLET | Freq: Two times a day (BID) | ORAL | Status: DC
  Administered 2023-08-16 – 2023-08-29 (×27): 600 mg via ORAL
  Filled 2023-08-16 (×27): qty 1

## 2023-08-16 MED ORDER — SODIUM CHLORIDE 0.9% FLUSH
3.0000 mL | Freq: Two times a day (BID) | INTRAVENOUS | Status: DC
  Administered 2023-08-16 – 2023-08-29 (×21): 3 mL via INTRAVENOUS

## 2023-08-16 MED ORDER — SODIUM CHLORIDE 0.9 % IV SOLN
1.0000 g | Freq: Once | INTRAVENOUS | Status: AC
  Administered 2023-08-16: 1 g via INTRAVENOUS
  Filled 2023-08-16: qty 10

## 2023-08-16 MED ORDER — SODIUM CHLORIDE 0.9 % IV SOLN: INTRAVENOUS | Status: DC

## 2023-08-16 MED ORDER — SODIUM CHLORIDE 0.9 % IV SOLN: 1.0000 g | Freq: Every day | INTRAVENOUS | Status: DC

## 2023-08-16 MED ORDER — ENSURE PLUS HIGH PROTEIN PO LIQD
237.0000 mL | Freq: Two times a day (BID) | ORAL | Status: DC
  Administered 2023-08-17 – 2023-08-29 (×19): 237 mL via ORAL

## 2023-08-16 MED ORDER — AZITHROMYCIN 250 MG PO TABS
500.0000 mg | ORAL_TABLET | Freq: Every day | ORAL | Status: AC
  Administered 2023-08-17 – 2023-08-20 (×4): 500 mg via ORAL
  Filled 2023-08-16 (×4): qty 2

## 2023-08-16 MED ORDER — ENOXAPARIN SODIUM 40 MG/0.4ML IJ SOSY
40.0000 mg | PREFILLED_SYRINGE | INTRAMUSCULAR | Status: DC
  Administered 2023-08-16 – 2023-08-29 (×13): 40 mg via SUBCUTANEOUS
  Filled 2023-08-16 (×14): qty 0.4

## 2023-08-16 MED ORDER — ACETAMINOPHEN 325 MG PO TABS
650.0000 mg | ORAL_TABLET | Freq: Four times a day (QID) | ORAL | Status: DC | PRN
  Administered 2023-08-17: 650 mg via ORAL
  Filled 2023-08-16: qty 2

## 2023-08-16 MED ORDER — ALBUTEROL SULFATE (2.5 MG/3ML) 0.083% IN NEBU: 2.5000 mg | INHALATION_SOLUTION | RESPIRATORY_TRACT | Status: DC | PRN

## 2023-08-16 MED ORDER — SODIUM CHLORIDE 0.9 % IV SOLN
500.0000 mg | Freq: Once | INTRAVENOUS | Status: AC
  Administered 2023-08-16: 500 mg via INTRAVENOUS
  Filled 2023-08-16: qty 5

## 2023-08-16 MED ORDER — SODIUM CHLORIDE 0.9 % IV SOLN: INTRAVENOUS | Status: AC

## 2023-08-16 NOTE — H&P (Signed)
 History and Physical    Patient: Fred Wise FMW:[id number] DOB: 1955/11/14 DOA: 08/16/2023 DOS: the patient was seen and examined on 08/16/2023 PCP: Marlana [name], MD  Patient coming from: Home via EMS  Chief Complaint:  Chief Complaint  Patient presents with   Shortness of Breath   HPI: Fred Wise is a 68 y.o. male with medical history significant of hypertension, arthritis, tobacco abuse presents with complaints of shortness of breath.  Most of history is obtained from patient's brother present at bedside  He has had progressive worsening shortness of breath over the last week.  Reports having a productive cough, sputum reported to be sometimes noting 'a little bit of blood' when coughing.  He has been wheezing as well as had some congestion.  He quit smoking approximately three months ago, initially stating he quit 'cold [place]', but later mentioning he used medication to aid in quitting.  He previously had smoked approximately 1 pack/cigarettes/day for over 30 years.  He has experienced significant weight loss over the last known 6 months or so, describing it as 'a lot'.  Denies having any abdominal pain, nausea, or vomiting.  It was also noted that he had fallen 2 days ago while coming out of a doctor office visit.  Family lastly reported that his mentation had been declining over the last couple months.  In with EMS patient had been given Solu-Medrol  125 mg IV.  O2 saturations reported to be as low as 90% on room air.  In the emergency department patient was noted to be afebrile with tachypnea, tachycardia, and O2 saturations maintained on room air.  Labs significant for WBC 11.7, platelets 456, high-sensitivity troponin negative at 11.  Chest x-ray noted dense consolidative disease in the right mid and lower lungs with possible component of collapse.  Follow-up CT noted concern for multi lobar pneumonia, right lower lobe lung mass could not be ruled out, moderate right-sided  pleural effusion, scattered nodules in the left lung that were noted to be complex and extensive in nature for which follow-up PA and lateral chest radiograph recommended in 4 weeks.  Review of Systems: As mentioned in the history of present illness. All other systems reviewed and are negative. Past Medical History:  Diagnosis Date   Arthritis    knees, uses wheelchair   Hypertension    Past Surgical History:  Procedure Laterality Date   COLONOSCOPY  05/25/2011   KNEE SURGERY  01/23/1989   left   MULTIPLE TOOTH EXTRACTIONS     TONSILLECTOMY  01/24/1975   Social History:  reports that he has been smoking cigarettes. He has never used smokeless tobacco. He reports that he does not drink alcohol and does not use drugs.  Wise Known Allergies  Family History  Problem Relation Age of Onset   Colon cancer Neg Hx    Stomach cancer Neg Hx    Esophageal cancer Neg Hx    Rectal cancer Neg Hx     Prior to Admission medications   Medication Sig Start Date End Date Taking? Authorizing Provider  albuterol  (PROVENTIL  HFA;VENTOLIN  HFA) 108 (90 Base) MCG/ACT inhaler Inhale 2 puffs into the lungs every 6 (six) hours as needed for wheezing or shortness of breath. Patient not taking: Reported on 10/20/2021 02/17/18   Drusilla Sabas RAMAN, MD  amLODipine  (NORVASC ) 5 MG tablet Take 5 mg by mouth daily.    [provider]  meloxicam (MOBIC) 7.5 MG tablet Take 7.5 mg by mouth daily.    [provider]  predniSONE  (DELTASONE ) 10 MG tablet Prednisone  40 mg po daily x 1 day then Prednisone  30 mg po daily x 1 day then Prednisone  20 mg po daily x 1 day then Prednisone  10 mg daily x 1 day then stop... Patient not taking: Reported on 10/20/2021 02/17/18   Drusilla Sabas RAMAN, MD    Physical Exam: Vitals:   08/16/23 0756 08/16/23 0845 08/16/23 0921 08/16/23 0945  BP: 120/68 109/69  113/73  Pulse: (!) 107 (!) 101  (!) 102  Resp: (!) 21 (!) 22  (!) 23  Temp:   97.6 F (36.4 C)   TempSrc:   Oral    SpO2: 95% 95%  95%    Constitutional: Cachectic elderly male currently in Wise acute distress Eyes: PERRL, lids and conjunctivae normal ENMT: Mucous membranes are moist.   Fair dentition. Neck: normal, supple  Respiratory: Decreased overall aeration with some rhonchi appreciated.  Patient currently on room air with O2 saturations maintained Cardiovascular: Regular rate and rhythm, Wise murmurs / rubs / gallops. Wise extremity edema. 2+ pedal pulses Abdomen: Wise tenderness, Wise masses palpated. Bowel sounds positive.  Musculoskeletal: Wise clubbing / cyanosis. Wise joint deformity upper and lower extremities. Good ROM, Wise contractures.  Muscle wasting. Skin: Wise rashes, lesions, ulcers. Wise induration Neurologic: CN 2-12 grossly intact. Sensation intact, DTR normal. Strength 5/5 in all 4.  Psychiatric:  Alert and oriented x person and place. Normal mood.   Data Reviewed:  Reviewed labs, imaging, and pertinent records as documented in the  Assessment and Plan:  Pneumonia Right lung diffusion Possible lung mass Patient presented with shortness of breath and cough with hemoptysis.  O2 saturations currently maintained on room air.  CT of the chest concerning for multifocal pneumonia with moderate right-sided pleural effusion and multiple pulmonary nodules.  Patient with significant history of tobacco abuse, but quit smoking approximately 3 months ago.  Patient was started on treatment for concerns for pneumonia, but question possibility of underlying malignancy. - Admit to the medical telemetry bed - Incentive spirometry and flutter valve - Check procalcitonin, ESR, CRP - Check urine Legionella and strep - Continue empiric antibiotics of Rocephin  and azithromycin  - Mucinex  - Pulmonology consulted, to evaluate - IR consulted for possible thoracentesis and analysis of fluid  Leukocytosis Acute.  Labs significant for WBC 11.7 with left shift.  Suspect secondary to above. - Continue to  monitor  Acute encephalopathy Family reported a progressive decline in mentation over the last couple of months.  Patient had also recently fallen 2 days ago at his doctor's appointment.  He is alert and oriented to person and self currently - Delirium precautions    - Checking CT scan of the head  Weight loss Patient lost an unknown amount of weight over the last several months. - Check TSH  Thrombocytosis Acute.  Platelet count elevated at 456.  Thought to be reactive in nature. - Continue to monitor  Tobacco abuse Patient has over 30 smoking pack-year history, but quit about 3 months ago. - Encouraged continued cessation of tobacco use  Severe protein calorie malnutrition Underweight BMI 12.12 kg/m - Check prealbumin in a.m. - Ensure shakes   DVT prophylaxis:   Lovenox  Advance Care Planning:   Code Status: Full Code    Consults: Pulmonology  Family Communication: Patient's brother updated at bedside  Severity of Illness: The appropriate patient status for this patient is INPATIENT. Inpatient status is judged to be reasonable and necessary in order to provide the required intensity  of service to ensure the patient's safety. The patient's presenting symptoms, physical exam findings, and initial radiographic and laboratory data in the context of their chronic comorbidities is felt to place them at high risk for further clinical deterioration. Furthermore, it is not anticipated that the patient will be medically stable for discharge from the hospital within 2 midnights of admission.   * I certify that at the point of admission it is my clinical judgment that the patient will require inpatient hospital care spanning beyond 2 midnights from the point of admission due to high intensity of service, high risk for further deterioration and high frequency of surveillance required.*  Author: Maximino DELENA Sharps, MD 08/16/2023 10:20 AM  For on call review [email or web address].

## 2023-08-16 NOTE — ED Notes (Signed)
 Nutrition called for lunch tray

## 2023-08-16 NOTE — ED Provider Notes (Signed)
 Accepted handoff at shift change from [facility], PA-C. Please see prior provider note for more detail.   Briefly: Patient is 68 y.o. with COPD presenting for shortness of breath and cough.  DDX: concern for  CHF, pericardial effusion/tamponade, arrhythmias, ACS, COPD, asthma, bronchitis, pneumonia, pneumothorax, PE, anemia   Plan: CT chest angio pending  Physical Exam  BP 113/73   Pulse (!) 102   Temp 97.6 F (36.4 C) (Oral)   Resp (!) 23   SpO2 95%   Physical Exam  Procedures  Procedures  ED Course / MDM   Clinical Course as of 08/16/23 1025  Thu Aug 16, 2023  0701 Here for shob. Has lost weight. No chest pain. CT angio for cancer vs PE. He is wheezing. Quit smoking 3 months ago.  [JR]  9047 CT Angio Chest PE W and/or Wo Contrast [JR]    Clinical Course User Index [JR] Lang Norleen POUR, PA-C   Medical Decision Making Amount and/or Complexity of Data Reviewed Labs: ordered. Radiology: ordered. Decision-making details documented in ED Course.  Risk Prescription drug management.   CT angio of the chest revealed multi lobar pneumonia on the right side with questionable mass.  Patient remained stable on room air.  Admitted to medicine with Dr. Claudene.  On azithromycin  and ceftriaxone .       Lang Norleen POUR, PA-C 08/16/23 1029    Tegeler, Lonni PARAS, MD 08/16/23 [phone]

## 2023-08-16 NOTE — Consult Note (Signed)
 NAME:  Fred Wise, MRN:  [id number], DOB:  April 10, 1955, LOS: 0 ADMISSION DATE:  08/16/2023, CONSULTATION DATE: 7/24 REFERRING MD: MD Claudene with [facility] CHIEF COMPLAINT: Shortness of breath  History of Present Illness:  Pt is a 68 yr old male with pmhx significant of COPD, HTN, arthritis, and tobacco abuse (quit smoking 3 months ago) presents by EMS from home to [facility] emergency department for shortness of breath, which has been going on for about over a week per her report. Initial workup in the emergency department patient was given Solu-Medrol  125 mg IV and O2 saturations were low 90s on room air, send not needing oxygen  at this standpoint.  Noted to patient to have tachypnea, tachycardia, and blood pressure mainly normotensive to slightly hypertensive.  Initial lab work revealed sodium of 136, potassium of 4.5, creatinine 0.75, BUN 16, WBC of 11.7, hemoglobin 13.6, BNP 69, troponin high-sensitivity 11, procalcitonin 0.57  Chest x-ray-indicated dense consolidation of the right lung along with right pleural effusion CT angio also completed-negative for PE, but concern for multilobar pneumonia, moderate side pleural effusion and and scattered nodules within the left lung-noted to be complex/extensive recommendations by radiologist for repeat imaging within 4 weeks.   PCCM consulted for possible bronc/Thora in regards to CT scan findings.  Upon assessing patient, history was difficult to obtain from patient due to orientation being off.  Patient could tell us  that he was in the hospital but not but not and not really why that he was in the hospital.  Most history was obtained by brother at bedside along with family friend.  Per patient's brother, patient started having increased phlegm over the last week which was thick and white in consistency.  Also brother endorsed that patient has been coughing more often as well as had significant weight loss since Christmas but unable to tell amount of weight  lost.  Brother believes that patient has quit smoking over the last 2 to 3 months.  Patient's brother and family, could only tell me that patient may have been smoking for at least 1 pack a day for 30 to 40 years.  Brother and family member denies patient having chills, fever, any sick contacts, and or significant bleeding.  Brother also stated that patient did drive to doctor's appointment this past Tuesday and fell in the parking lot.  Not able to determine if this was witnessed or not.  In regards to the mentation/lack of orientation, patient's brother stated that mentation has been getting worse over the last couple months     Pertinent  Medical History   Past Medical History:  Diagnosis Date   Arthritis    knees, uses wheelchair   Hypertension      Significant Hospital Events: Including procedures, antibiotic start and stop dates in addition to other pertinent events   PCCM consult for Thora/bronch due to concerning CT findings-showing multi lobar pneumonia/nodules scattered through the left lung, concern for possible lung cancer  Interim History / Subjective:  Patient pleasantly confused Cachectic Brother at bedside along with family friend able to answer questions for patient  Objective    Blood pressure 119/85, pulse (!) 109, temperature 98.1 F (36.7 C), temperature source Oral, resp. rate (!) 23, SpO2 94%.       No intake or output data in the 24 hours ending 08/16/23 1634 There were no vitals filed for this visit.  Examination: General: Chronically ill cachectic older adult male sitting up in floor bed in NAD HENT: Normocephalic,  PERRLA intact, teeth intact, pink mucous membranes Lungs: Rhonchi noted in upper bases, diminished in lower, on room air Cardiovascular: S1, S2, RRR, no JVD, MRG Abdomen: Bowel sounds active, soft Extremities: Deconditioned, malnourished Neuro: Alert, oriented x 2, very forgetful GU: Deferred  Resolved problem list   Assessment and  Plan  Multilobar pneumonia/CAP Concern for malignancy Weight loss Right lateral moderate effusion COPD Chest x-ray-indicated dense consolidation of the right lung along with right pleural effusion CT angio also completed-negative for PE, but concern for multilobar pneumonia, moderate side pleural effusion and and scattered nodules within the left lung-noted to be complex/extensive recommendations by radiologist for repeat imaging within 4 weeks.  Patient on room air, in no distress, has no pain or pleuritic pain on right side of chest WBCs elevated at 11.7, intermittent productive cough-with white thick mucus per patient and brother at bedside increasing over the last week with shortness of breath Suspect this is a very significant community-acquired pneumonia but cannot rule out malignancy at this time especially with the significant weight loss since Christmas and evidence of scattered nodules within the left lung-revealed on the CT COVID, flu, RSV negative P: Continue CAP coverage with azithromycin  and ceftriaxone  Continue Mucinex  Continue DuoNebs Obtain strep Legionella per urine Obtain RVP panel as well We will place IR consult order-to evaluate possible need for right chest tube for moderate size pleural effusion that appears to be loculated Incentive spirometer and flutter valve Collect sputum sample and follow-up with results PT and OT consult Mobilize daily out of bed Continue deep breathing and coughing exercises Will need follow-up CT imaging within 4 weeks to a month to evaluate possible malignancy Also will need colonoscopy as well-patient and family unsure when the last colonoscopy was obtained-per brother patient did have a polyp removed from the last colonoscopy conducted Continue droplet precautions for now  Other Hospital problems managed by primary Altered mental status-recommend additional workup for altered mental status especially since had fall this past Tuesday  out of a parking lot when going to doctors appointment.-Obtain CT of head, discussed with primary physician Hypertension Arthritis Tobacco abuse  Best Practice (right click and Reselect all SmartList Selections daily)   Per Primary   Labs   CBC: Recent Labs  Lab 08/16/23 0538  WBC 11.7*  NEUTROABS 9.0*  HGB 13.6  HCT 41.7  MCV 86.3  PLT 456*    Basic Metabolic Panel: Recent Labs  Lab 08/16/23 0538  NA 136  K 4.5  CL 99  CO2 23  GLUCOSE 84  BUN 16  CREATININE 0.75  CALCIUM 8.9   GFR: CrCl cannot be calculated (Unknown ideal weight.). Recent Labs  Lab 08/16/23 0538 08/16/23 1246  PROCALCITON  --  0.57  WBC 11.7*  --     Liver Function Tests: Recent Labs  Lab 08/16/23 0538  AST 22  ALT 14  ALKPHOS 58  BILITOT 1.3*  PROT 6.0*  ALBUMIN 2.1*   No results for input(s): LIPASE, AMYLASE in the last 168 hours. No results for input(s): AMMONIA in the last 168 hours.  ABG No results found for: PHART, PCO2ART, PO2ART, HCO3, TCO2, ACIDBASEDEF, O2SAT   Coagulation Profile: No results for input(s): INR, PROTIME in the last 168 hours.  Cardiac Enzymes: No results for input(s): CKTOTAL, CKMB, CKMBINDEX, TROPONINI in the last 168 hours.  HbA1C: No results found for: HGBA1C  CBG: No results for input(s): GLUCAP in the last 168 hours.  Review of Systems:   See HPI  Past Medical  History:  He,  has a past medical history of Arthritis and Hypertension.   Surgical History:   Past Surgical History:  Procedure Laterality Date   COLONOSCOPY  05/25/2011   KNEE SURGERY  01/23/1989   left   MULTIPLE TOOTH EXTRACTIONS     TONSILLECTOMY  01/24/1975     Social History:   reports that he has been smoking cigarettes. He has never used smokeless tobacco. He reports that he does not drink alcohol and does not use drugs.   Family History:  His family history is negative for Colon cancer, Stomach cancer, Esophageal cancer,  and Rectal cancer.   Allergies No Known Allergies   Home Medications  Prior to Admission medications   Medication Sig Start Date End Date Taking? Authorizing Provider  albuterol  (PROVENTIL  HFA;VENTOLIN  HFA) 108 (90 Base) MCG/ACT inhaler Inhale 2 puffs into the lungs every 6 (six) hours as needed for wheezing or shortness of breath. 02/17/18  Yes Drusilla, Sabas RAMAN, MD  amLODipine  (NORVASC ) 2.5 MG tablet Take 2.5 mg by mouth daily.   Yes [provider]  amoxicillin (AMOXIL) 500 MG capsule Take 500 mg by mouth 2 (two) times daily. 08/14/23 08/21/23 Yes [provider]     Total Time 50 minutes     Sherlean Sharps AGACNP-BC   [facility] 08/16/2023, 6:20 PM  Please see [email or web address] for pager details.  From 7A-7P if no response, please call [phone]. After hours, please call ELink [phone].

## 2023-08-16 NOTE — ED Provider Notes (Signed)
 [facility] Provider Note   CSN: [id number] Arrival date & time: 08/16/23  9472     Patient presents with: Shortness of Breath   Fred Wise is a 68 y.o. male with history of COPD and hypertension presents emerged from today for evaluation of 3 days of shortness of breath.  He comes via EMS reports that he was found on 90% on room air but improved after DuoNeb.  He had diffuse expiratory wheezing throughout all lung fields per EMS.  He denies any chest pain or fever.  Reports has had some runny nose and nasal congestion.  EMS is mentions that he has had recent weight loss with past 6 months is unintentional but unsure how much.  He was given Solu-Medrol  and DuoNeb with EMS.  He denies any leg swelling, abdominal pain, nausea, vomiting.  Reports has been more of a dry cough.   Shortness of Breath Associated symptoms: cough   Associated symptoms: no abdominal pain, no chest pain and no fever        Prior to Admission medications   Medication Sig Start Date End Date Taking? Authorizing Provider  albuterol  (PROVENTIL  HFA;VENTOLIN  HFA) 108 (90 Base) MCG/ACT inhaler Inhale 2 puffs into the lungs every 6 (six) hours as needed for wheezing or shortness of breath. Patient not taking: Reported on 10/20/2021 02/17/18   Drusilla Sabas RAMAN, MD  amLODipine  (NORVASC ) 5 MG tablet Take 5 mg by mouth daily.    [provider]  meloxicam (MOBIC) 7.5 MG tablet Take 7.5 mg by mouth daily.    [provider]  predniSONE  (DELTASONE ) 10 MG tablet Prednisone  40 mg po daily x 1 day then Prednisone  30 mg po daily x 1 day then Prednisone  20 mg po daily x 1 day then Prednisone  10 mg daily x 1 day then stop... Patient not taking: Reported on 10/20/2021 02/17/18   Drusilla Sabas RAMAN, MD    Allergies: Patient has no known allergies.    Review of Systems  Constitutional:  Negative for chills and fever.  HENT:  Positive for congestion. Negative for rhinorrhea.    Respiratory:  Positive for cough and shortness of breath.   Cardiovascular:  Negative for chest pain, palpitations and leg swelling.  Gastrointestinal:  Negative for abdominal pain.    Updated Vital Signs BP 123/84 (BP Location: Right Arm)   Pulse (!) 110   Temp 97.7 F (36.5 C) (Oral)   Resp (!) 22   SpO2 94%   Physical Exam Vitals and nursing note reviewed.  Constitutional:      Comments: Cachectic, chronically ill-appearing but no acute distress  Cardiovascular:     Rate and Rhythm: Tachycardia present.  Pulmonary:     Effort: Pulmonary effort is normal.     Breath sounds: Decreased breath sounds and wheezing present.     Comments: Patient is able to speak almost full sentences but does seem to take a break.  No tripoding.  No retractions or accessory muscle use.  He does have some diminished breath sounds, right greater than left with more expiratory wheeze/rhonchi auscultated in the right upper lobe.  Satting 95% on room air. Musculoskeletal:     Right lower leg: No tenderness. No edema.     Left lower leg: No tenderness. No edema.  Skin:    General: Skin is warm and dry.  Neurological:     Mental Status: He is alert.     (all labs ordered are listed, but  only abnormal results are displayed) Labs Reviewed  CBC WITH DIFFERENTIAL/PLATELET - Abnormal; Notable for the following components:      Result Value   WBC 11.7 (*)    Platelets 456 (*)    Neutro Abs 9.0 (*)    Abs Immature Granulocytes 0.09 (*)    All other components within normal limits  COMPREHENSIVE METABOLIC PANEL WITH GFR - Abnormal; Notable for the following components:   Total Protein 6.0 (*)    Albumin 2.1 (*)    Total Bilirubin 1.3 (*)    All other components within normal limits  RESP PANEL BY RT-PCR (RSV, FLU A&B, COVID)  RVPGX2  BRAIN NATRIURETIC PEPTIDE  TROPONIN I (HIGH SENSITIVITY)  TROPONIN I (HIGH SENSITIVITY)    EKG: EKG Interpretation Date/Time:  Thursday August 16 2023 06:15:59  EDT Ventricular Rate:  114 PR Interval:  137 QRS Duration:  81 QT Interval:  336 QTC Calculation: 463 R Axis:   268  Text Interpretation: Sinus tachycardia Left anterior fascicular block Anteroseptal infarct, age indeterminate No previous tracing Confirmed by Haze Lonni PARAS [phone]) on 08/16/2023 7:04:19 AM  Radiology: DG Chest Portable 1 View Result Date: 08/16/2023 CLINICAL DATA:  Shortness of breath. EXAM: PORTABLE CHEST 1 VIEW COMPARISON:  02/16/2018 FINDINGS: Lungs are hyperexpanded. Dense consolidative disease is seen in the right mid and lower lung with some probable associated component of collapse. Associated right pleural effusion evident. There is patchy airspace disease at the left base. Interstitial markings are diffusely coarsened with chronic features. No acute bony abnormality. IMPRESSION: 1. Dense consolidative disease in the right mid and lower lung with some probable associated component of collapse. Imaging features compatible with pneumonia. Associated right pleural effusion. Consider chest CT with contrast to exclude central obstructing neoplasm. 2. Patchy airspace disease at the left base. Electronically Signed   By: Camellia Candle M.D.   On: 08/16/2023 06:56   Procedures   Medications Ordered in the ED  cefTRIAXone  (ROCEPHIN ) 1 g in sodium chloride  0.9 % 100 mL IVPB (1 g Intravenous New Bag/Given 08/16/23 0725)  azithromycin  (ZITHROMAX ) 500 mg in sodium chloride  0.9 % 250 mL IVPB (has no administration in time range)  magnesium  sulfate IVPB 2 g 50 mL (0 g Intravenous Stopped 08/16/23 0716)  ipratropium-albuterol  (DUONEB) 0.5-2.5 (3) MG/3ML nebulizer solution 3 mL (3 mLs Nebulization Given 08/16/23 0558)    Clinical Course as of 08/16/23 0747  Thu Aug 16, 2023  0701 Here for shob. Has lost weight. No chest pain. CT angio for cancer vs PE. He is wheezing. Quit smoking 3 months ago.  [JR]    Clinical Course User Index [JR] Lang Norleen POUR, PA-C   Medical Decision  Making Amount and/or Complexity of Data Reviewed Labs: ordered. Radiology: ordered.  Risk Prescription drug management.   68 y.o. male presents to the ER for evaluation of shortness of breath and cough. Differential diagnosis includes but is not limited to CHF, pericardial effusion/tamponade, arrhythmias, ACS, COPD, asthma, bronchitis, pneumonia, pneumothorax, PE, anemia. Vital signs mildly tachycardic otherwise unremarkable. Physical exam as noted above.   Patient with self-reported history of COPD.  Has had recent weight loss over the last 6 months however stable to give a quantifiable number.  He denies any recent sick contacts.  Will proceed with shortness with workup.  I have ordered him an additional DuoNeb and so magnesium .  Will reevaluate  Patient reports that he is still feeling better.  Still mildly tachycardic.  I independently reviewed and interpreted the patient's  labs.  BNP within normal limits.  Troponin within normal limits.  CMP shows mildly decreased total protein albumin.  Mildly increased total bili at 1.3.  No other electrolyte or LFT abnormality.  CBC shows slight leukocytosis 11.7 with a left shift.  Mildly increased platelets of 456.  No anemia.  BMP within normal limits.  CXR shows 1. Dense consolidative disease in the right mid and lower lung with some probable associated component of collapse. Imaging features compatible with pneumonia. Associated right pleural effusion. Consider chest CT with contrast to exclude central obstructing neoplasm. 2. Patchy airspace disease at the left base.   I have ordered CT imaging to better clarify what seen on chest x-ray.  He was given Rocephin  and azithromycin  for potential pneumonia.  Will order CT angio to rule out any PE as well.  Patient reports he is feeling better after the DuoNeb and magnesium .  Still mildly tachycardic.  Could be from the breathing treatments.  Concern for malignancy.  Will handoff to oncoming shift to  follow-up with CT scan.  Care of Sim Lesches  transferred to PA Norleen Essex at the end of my shift as the patient will require reassessment once labs/imaging have resulted. Patient presentation, ED course, and plan of care discussed with review of all pertinent labs and imaging. Please see his/her note for further details regarding further ED course and disposition. Plan at time of handoff is follow up with CT results, likely admission. This may be altered or completely changed at the discretion of the oncoming team pending results of further workup.  Portions of this report may have been transcribed using voice recognition software. Every effort was made to ensure accuracy; however, inadvertent computerized transcription errors may be present.    Final diagnoses:  None    ED Discharge Orders     None          Bernis Ernst, [place] 08/16/23 0825    Haze Lonni PARAS, MD 08/17/23 [phone]

## 2023-08-16 NOTE — ED Notes (Signed)
 Patient transported to CT

## 2023-08-16 NOTE — ED Triage Notes (Signed)
 Patient BIB from home for eval of SOB x3 days. Expiratory wheezing. Duoneb and 125mg  solumedrol with EMS. Patient 90% on RA. Productive cough. No relief with home inhaler use.

## 2023-08-16 NOTE — ED Notes (Signed)
 Called cafe for tray, left kitchen @ 1233 so otw up!

## 2023-08-16 NOTE — ED Notes (Signed)
 CCMD called, pt on monitor

## 2023-08-17 ENCOUNTER — Inpatient Hospital Stay (HOSPITAL_COMMUNITY)

## 2023-08-17 DIAGNOSIS — J90 Pleural effusion, not elsewhere classified: Secondary | ICD-10-CM | POA: Diagnosis not present

## 2023-08-17 DIAGNOSIS — J869 Pyothorax without fistula: Secondary | ICD-10-CM

## 2023-08-17 DIAGNOSIS — J189 Pneumonia, unspecified organism: Secondary | ICD-10-CM | POA: Diagnosis not present

## 2023-08-17 DIAGNOSIS — J449 Chronic obstructive pulmonary disease, unspecified: Secondary | ICD-10-CM | POA: Diagnosis not present

## 2023-08-17 LAB — BASIC METABOLIC PANEL WITH GFR
Anion gap: 12 (ref 5–15)
BUN: 24 mg/dL — ABNORMAL HIGH (ref 8–23)
CO2: 23 mmol/L (ref 22–32)
Calcium: 8.5 mg/dL — ABNORMAL LOW (ref 8.9–10.3)
Chloride: 101 mmol/L (ref 98–111)
Creatinine, Ser: 0.8 mg/dL (ref 0.61–1.24)
GFR, Estimated: >60 mL/min (ref >60)
Glucose, Bld: 118 mg/dL — ABNORMAL HIGH (ref 70–99)
Potassium: 3.5 mmol/L (ref 3.5–5.1)
Sodium: 136 mmol/L (ref 135–145)

## 2023-08-17 LAB — RESPIRATORY PANEL BY PCR

## 2023-08-17 LAB — PREALBUMIN: Prealbumin: <5 mg/dL — ABNORMAL LOW (ref 18–38)

## 2023-08-17 LAB — CBC
HCT: 39.9 % (ref 39.0–52.0)
Hemoglobin: 13.1 g/dL (ref 13.0–17.0)
MCH: 28.3 pg (ref 26.0–34.0)
MCHC: 32.8 g/dL (ref 30.0–36.0)
MCV: 86.2 fL (ref 80.0–100.0)
Platelets: 451 K/uL — ABNORMAL HIGH (ref 150–400)
RBC: 4.63 MIL/uL (ref 4.22–5.81)
RDW: 15.3 % (ref 11.5–15.5)
WBC: 10.2 K/uL (ref 4.0–10.5)
nRBC: 0 % (ref 0.0–0.2)

## 2023-08-17 LAB — LEGIONELLA PNEUMOPHILA SEROGP 1 UR AG: L. pneumophila Serogp 1 Ur Ag: NEGATIVE

## 2023-08-17 LAB — PROTIME-INR
INR: 1.1 (ref 0.8–1.2)
Prothrombin Time: 15.1 s (ref 11.4–15.2)

## 2023-08-17 MED ORDER — FENTANYL CITRATE (PF) 100 MCG/2ML IJ SOLN
INTRAMUSCULAR | Status: AC
  Filled 2023-08-17: qty 2

## 2023-08-17 MED ORDER — IPRATROPIUM-ALBUTEROL 0.5-2.5 (3) MG/3ML IN SOLN: 3.0000 mL | RESPIRATORY_TRACT | Status: DC | PRN

## 2023-08-17 MED ORDER — MORPHINE SULFATE (PF) 2 MG/ML IV SOLN
2.0000 mg | INTRAVENOUS | Status: DC | PRN
  Administered 2023-08-19: 2 mg via INTRAVENOUS
  Filled 2023-08-17: qty 1

## 2023-08-17 MED ORDER — MIDAZOLAM HCL 2 MG/2ML IJ SOLN
INTRAMUSCULAR | Status: AC
  Filled 2023-08-17: qty 2

## 2023-08-17 MED ORDER — FENTANYL CITRATE (PF) 100 MCG/2ML IJ SOLN
INTRAMUSCULAR | Status: AC | PRN
  Administered 2023-08-17: 25 ug via INTRAVENOUS

## 2023-08-17 MED ORDER — LIDOCAINE-EPINEPHRINE 1 %-1:100000 IJ SOLN
INTRAMUSCULAR | Status: AC
  Filled 2023-08-17: qty 1

## 2023-08-17 MED ORDER — MIDAZOLAM HCL 2 MG/2ML IJ SOLN
INTRAMUSCULAR | Status: AC | PRN
  Administered 2023-08-17: 1 mg via INTRAVENOUS

## 2023-08-17 MED ORDER — OXYCODONE HCL 5 MG PO TABS
5.0000 mg | ORAL_TABLET | ORAL | Status: DC | PRN
  Administered 2023-08-19 – 2023-08-27 (×2): 5 mg via ORAL
  Filled 2023-08-17 (×3): qty 1

## 2023-08-17 NOTE — Plan of Care (Signed)

## 2023-08-17 NOTE — Evaluation (Signed)
 Clinical/Bedside Swallow Evaluation Patient Details  Name: Fred Wise MRN: [id number] Date of Birth: [birth year]  Today's Date: 08/17/2023 Time: SLP Start Time (ACUTE ONLY): 0950 SLP Stop Time (ACUTE ONLY): 0958 SLP Time Calculation (min) (ACUTE ONLY): 8 min  Past Medical History:  Past Medical History:  Diagnosis Date   Arthritis    knees, uses wheelchair   Hypertension    Past Surgical History:  Past Surgical History:  Procedure Laterality Date   COLONOSCOPY  05/25/2011   KNEE SURGERY  01/23/1989   left   MULTIPLE TOOTH EXTRACTIONS     TONSILLECTOMY  01/24/1975   HPI:  Pt is a 68 yr old male who presented by EMS from home to [facility] emergency department for shortness of breath, which has been going on for about over a week per her report. CT Chest 7/24: 2. Consolidation of the entire right lower lobe and right middle  lobe, as well as much of the posterior portion of the right upper  lobe, with associated air bronchograms. Consolidation and scattered  nodularity along with varicoid bronchiectasis in the left lower  lobe. Some of the areas of nodularity are probably areas of cystic  bronchiectasis. Appearance compatible with multilobar pneumonia.  Strictly speaking a right lung mass particularly in the right lower  lobe cannot be excluded given the degree airspace opacity and airway  truncation.  3. Scattered nodules in the left lung. Given the complex and  extensive nature findings in this case, I recommend followup PA and  lateral chest radiography in 4 weeks time after appropriate  antibiotic therapy to reassess the lungs, and consideration of  noncontrast chest CT or PET-CT in 1-3 months to reassess the  underlying nodularity. Pt with pmhx significant of COPD, HTN, arthritis, and tobacco abuse (quit smoking 3 months ago).    Assessment / Plan / Recommendation  Clinical Impression  Pt presents with clinical indicators of pharyngeal dysphagia.  Pt with subtle, wet ahhh v  throat clear after serial straw sips of thin liquid.  With cup sip pt requried 2 swallows and my have exhibited slight change in vocal quality.  Pt exhibited good oral clearance of solids and there were no overt s/s of aspiration with solids.  Pt appears cachetic.  He endorses recent weightloss, but says he feels like his appetite is starting to come back.  Of note: lingual deficits observed during OME.  Protrusion to L noted with instructions to hold out straight, and pt unable to lateralize to R side. Given clinical presentation and abnormal chest imaging, recommend further assessment of swallow function.  Pt with possible IR procedure today which may preclude MBS today.  SLP will plan for further swallow assessment as schedule permits.  Recommend pt remain NPO pending results of swallow study; however, diet could be placed at provider's discretion given uncertainty about scheduling.  SLP Visit Diagnosis: Dysphagia, unspecified (R13.10)    Aspiration Risk  Mild aspiration risk    Diet Recommendation NPO    Medication Administration: Crushed with puree (priority oral meds withe puree)    Other  Recommendations Recommended Consults: Consider ENT evaluation Oral Care Recommendations: Oral care QID     Assistance Recommended at Discharge    Functional Status Assessment  (TBD)  Frequency and Duration  (TBD)          Prognosis Prognosis for improved oropharyngeal function:  (TBD)      Swallow Study   General Date of Onset: 08/16/23 HPI: Pt is a 68 yr old  male who presented by EMS from home to [facility] emergency department for shortness of breath, which has been going on for about over a week per her report. CT Chest 7/24: 2. Consolidation of the entire right lower lobe and right middle  lobe, as well as much of the posterior portion of the right upper  lobe, with associated air bronchograms. Consolidation and scattered  nodularity along with varicoid bronchiectasis in the left lower   lobe. Some of the areas of nodularity are probably areas of cystic  bronchiectasis. Appearance compatible with multilobar pneumonia.  Strictly speaking a right lung mass particularly in the right lower  lobe cannot be excluded given the degree airspace opacity and airway  truncation.  3. Scattered nodules in the left lung. Given the complex and  extensive nature findings in this case, I recommend followup PA and  lateral chest radiography in 4 weeks time after appropriate  antibiotic therapy to reassess the lungs, and consideration of  noncontrast chest CT or PET-CT in 1-3 months to reassess the  underlying nodularity. Pt with pmhx significant of COPD, HTN, arthritis, and tobacco abuse (quit smoking 3 months ago). Type of Study: Bedside Swallow Evaluation Previous Swallow Assessment: None Diet Prior to this Study: NPO History of Recent Intubation: No Behavior/Cognition: Alert;Cooperative;Pleasant mood Oral Cavity Assessment: Within Functional Limits Oral Care Completed by SLP: No Oral Cavity - Dentition: Dentures, top;Dentures, bottom Vision: Functional for self-feeding Self-Feeding Abilities: Able to feed self Patient Positioning: Upright in bed Baseline Vocal Quality: Normal Volitional Cough: Strong Volitional Swallow: Able to elicit    Oral/Motor/Sensory Function Overall Oral Motor/Sensory Function: Moderate impairment Facial ROM: Within Functional Limits Facial Symmetry: Within Functional Limits Lingual ROM: Reduced right Lingual Symmetry: Abnormal symmetry left Lingual Strength: Reduced Velum: Within Functional Limits Mandible: Within Functional Limits   Ice Chips Ice chips: Not tested   Thin Liquid Thin Liquid: Impaired Pharyngeal  Phase Impairments: Wet Vocal Quality;Throat Clearing - Immediate    Nectar Thick Nectar Thick Liquid: Not tested   Honey Thick Honey Thick Liquid: Not tested   Puree Puree: Within functional limits Presentation: Spoon   Solid     Solid: Within  functional limits Presentation: Self Fed      Anette FORBES Grippe, MA, CCC-SLP Acute Rehabilitation Services Office: [phone] 08/17/2023,10:12 AM

## 2023-08-17 NOTE — Hospital Course (Addendum)
 Patient with PMH of HTN, smoker, failure to thrive, presented to the hospital with complaints of cough and shortness of breath. Found to have right lower lobe pneumonia as well as possible parapneumonic effusion. Pulmonary consulted. Underwent IR guided chest tube placement on 7/25.  Assessment and Plan: Right lower lobe pneumonia. Right pleural effusion with loculation. Possible lung mass. Post chest tube insertion pneumothorax Appreciate pulmonary consultation. Suspect most likely secondary to pneumonia versus.  Malignancy Receiving IV antibiotics. Underwent IR guided chest tube insertion on 7/25. Pleural effusion analysis shows elevated LDH meeting lights criteria..  To fluid. All the total protein is negative. Will monitor output. Chest x-ray after the chest tube insertion shows evidence of moderate pneumothorax concerning for trapped lung. Pleural lytic therapy done by pulmonary on 7/26.  Dysphagia. SLP evaluation. MBS performed. Dysphagia 2 diet recommended.  Acute metabolic encephalopathy. Likely in the setting of poor p.o. intake as well as infection. Mentation improving.  No focal deficit.  Weight loss. Adult failure to thrive. Underweight. Severe protein calorie malnutrition. Body mass index is 12.12 kg/m.  Dietitian following. Will monitor. Placing the patient at high risk for poor outcome.  Former smoker. Quit 3 months ago. Monitor for now.

## 2023-08-17 NOTE — Progress Notes (Signed)
 NAME:  Fred Wise, MRN:  [id number], DOB:  1955-10-18, LOS: 1 ADMISSION DATE:  08/16/2023, CONSULTATION DATE: 7/24 REFERRING MD: MD Claudene with [facility] CHIEF COMPLAINT: Shortness of breath  History of Present Illness:  Pt is a 68 yr old male with pmhx significant of COPD, HTN, arthritis, and tobacco abuse (quit smoking 3 months ago) presents by EMS from home to [facility] emergency department for shortness of breath, which has been going on for about over a week per her report. Initial workup in the emergency department patient was given Solu-Medrol  125 mg IV and O2 saturations were low 90s on room air, send not needing oxygen  at this standpoint.  Noted to patient to have tachypnea, tachycardia, and blood pressure mainly normotensive to slightly hypertensive.  Initial lab work revealed sodium of 136, potassium of 4.5, creatinine 0.75, BUN 16, WBC of 11.7, hemoglobin 13.6, BNP 69, troponin high-sensitivity 11, procalcitonin 0.57  Chest x-ray-indicated dense consolidation of the right lung along with right pleural effusion CT angio also completed-negative for PE, but concern for multilobar pneumonia, moderate side pleural effusion and and scattered nodules within the left lung-noted to be complex/extensive recommendations by radiologist for repeat imaging within 4 weeks.   PCCM consulted for possible bronc/Thora in regards to CT scan findings.  Upon assessing patient, history was difficult to obtain from patient due to orientation being off.  Patient could tell us  that he was in the hospital but not but not and not really why that he was in the hospital.  Most history was obtained by brother at bedside along with family friend.  Per patient's brother, patient started having increased phlegm over the last week which was thick and white in consistency.  Also brother endorsed that patient has been coughing more often as well as had significant weight loss since Christmas but unable to tell amount of weight  lost.  Brother believes that patient has quit smoking over the last 2 to 3 months.  Patient's brother and family, could only tell me that patient may have been smoking for at least 1 pack a day for 30 to 40 years.  Brother and family member denies patient having chills, fever, any sick contacts, and or significant bleeding.  Brother also stated that patient did drive to doctor's appointment this past Tuesday and fell in the parking lot.  Not able to determine if this was witnessed or not.  In regards to the mentation/lack of orientation, patient's brother stated that mentation has been getting worse over the last couple months  Pertinent  Medical History   Past Medical History:  Diagnosis Date   Arthritis    knees, uses wheelchair   Hypertension      Significant Hospital Events: Including procedures, antibiotic start and stop dates in addition to other pertinent events   7/24: pccm consult for loculated effusion  7/25: plan for IR pigtail CT placement today   Interim History / Subjective:  Feels okay this morning, no pain, no complaints. He knows about CT placement planned for today.   Objective    Blood pressure 103/67, pulse (!) 116, temperature 98.5 F (36.9 C), temperature source Oral, resp. rate 14, weight 44 kg, SpO2 93%.        Intake/Output Summary (Last 24 hours) at 08/17/2023 0847 Last data filed at 08/17/2023 0700 Gross per 24 hour  Intake 601.84 ml  Output 800 ml  Net -198.16 ml   Filed Weights   08/16/23 1647  Weight: 44 kg  Examination: General: older male, cachectic, no distress  HENT: ncat, perrla, mmm, room air Lungs: room air, no resp distress, no air movement of the RLL region. Rhonchi elsewhere. No wheezing. Productive cough  Cardiovascular: s1s2 no m/r/g Abdomen: flat, soft  Extremities: cachectic  Neuro: alert, oriented to place and time. Seems to know he is getting CT placement, but pleasantly disoriented to situation GU: Deferred  Resolved  problem list   Assessment and Plan  Multilobar pneumonia/CAP Concern for malignancy Weight loss - unknown over months  Right lateral moderate effusion COPD CXR with right pleural effusion  CTA chest negative for PE. Does show RLL pneumonia, loculated R effusion. He has emphysema on imaging.  WBC 11.7, procal 0.57. HIV negative. S.pna negative. COVID/flu/RSV negative.  - IR for pigtail CT placement for loculated effusion.  - post intervention CXR, and determination for lytic needs  - azithromycin  and rocephin  for CAP coverage  - f/u sputum culture, pleural studies  - f/u legionella, 20 pathogen panel  - swall eval for aspiration  - con't mucinex , duonebs,  - flutter valve, IS  - repeat CT in 1 month   Other Hospital problems managed by primary Altered mental status - CT head yesterday negative.  Hypertension Arthritis Tobacco abuse  Best Practice (right click and Reselect all SmartList Selections daily)   Per Primary   Labs   CBC: Recent Labs  Lab 08/16/23 0538 08/17/23 0551  WBC 11.7* 10.2  NEUTROABS 9.0*  --   HGB 13.6 13.1  HCT 41.7 39.9  MCV 86.3 86.2  PLT 456* 451*    Basic Metabolic Panel: Recent Labs  Lab 08/16/23 0538 08/17/23 0551  NA 136 136  K 4.5 3.5  CL 99 101  CO2 23 23  GLUCOSE 84 118*  BUN 16 24*  CREATININE 0.75 0.80  CALCIUM 8.9 8.5*   GFR: CrCl cannot be calculated (Unknown ideal weight.). Recent Labs  Lab 08/16/23 0538 08/16/23 1246 08/17/23 0551  PROCALCITON  --  0.57  --   WBC 11.7*  --  10.2    Liver Function Tests: Recent Labs  Lab 08/16/23 0538  AST 22  ALT 14  ALKPHOS 58  BILITOT 1.3*  PROT 6.0*  ALBUMIN 2.1*   No results for input(s): LIPASE, AMYLASE in the last 168 hours. No results for input(s): AMMONIA in the last 168 hours.  ABG No results found for: PHART, PCO2ART, PO2ART, HCO3, TCO2, ACIDBASEDEF, O2SAT   Coagulation Profile: No results for input(s): INR, PROTIME in the  last 168 hours.  Cardiac Enzymes: No results for input(s): CKTOTAL, CKMB, CKMBINDEX, TROPONINI in the last 168 hours.  HbA1C: No results found for: HGBA1C  CBG: No results for input(s): GLUCAP in the last 168 hours.  Review of Systems:   See HPI  Past Medical History:  He,  has a past medical history of Arthritis and Hypertension.   Surgical History:   Past Surgical History:  Procedure Laterality Date   COLONOSCOPY  05/25/2011   KNEE SURGERY  01/23/1989   left   MULTIPLE TOOTH EXTRACTIONS     TONSILLECTOMY  01/24/1975     Social History:   reports that he has been smoking cigarettes. He has never used smokeless tobacco. He reports that he does not drink alcohol and does not use drugs.   Family History:  His family history is negative for Colon cancer, Stomach cancer, Esophageal cancer, and Rectal cancer.   Allergies No Known Allergies   Home Medications  Prior to Admission  medications   Medication Sig Start Date End Date Taking? Authorizing Provider  albuterol  (PROVENTIL  HFA;VENTOLIN  HFA) 108 (90 Base) MCG/ACT inhaler Inhale 2 puffs into the lungs every 6 (six) hours as needed for wheezing or shortness of breath. 02/17/18  Yes Drusilla, Sabas RAMAN, MD  amLODipine  (NORVASC ) 2.5 MG tablet Take 2.5 mg by mouth daily.   Yes [provider]  amoxicillin (AMOXIL) 500 MG capsule Take 500 mg by mouth 2 (two) times daily. 08/14/23 08/21/23 Yes [provider]     Total Time 30    Tinnie FORBES Adolph DEVONNA [facility] 08/17/23 8:47 AM  Please see [email or web address] for pager details.  From 7A-7P if no response, please call [phone] After hours, please call ELink [phone]

## 2023-08-17 NOTE — Procedures (Signed)
 Pre procedural Dx: right sided effusion  Post procedural Dx: Same  Technically successful CT guided placed of a 10 Fr drainage catheter placement into the right pleural space yielding serosanguinous material.      EBL: Trace Complications: None immediate  Fred Banner, MD Pager #: [phone]

## 2023-08-17 NOTE — Consult Note (Signed)
 Chief Complaint: Loculated R pleural effusion  Referring Provider(s): Fonda Sharps, NP  Supervising Physician: Jenna Hacker  Patient Status: [facility] - In-pt  History of Present Illness: Fred Wise is a 68 y.o. male with PMH significant for HTN, tobacco use (quit 3 mo ago), arthritis admitted after presenting for shortness of breath. Workup reveals R lung infiltrate, possible lung mass, and a loculated effusion. IR consulted, recommend pigtail chest tube rather than thoracentesis d/t loculation.  Confirms NPO since MN.  Does not wear CPAP or use supplemental home O2 .  Denies fever, chills, CP, sore throat, N/V, abd pain. He does not endorse SOB but appears dyspneic from conversation. Endorses significant weight loss.   Allergies Reviewed:  Patient has no known allergies.   Patient is Full Code  Past Medical History:  Diagnosis Date   Arthritis    knees, uses wheelchair   Hypertension     Past Surgical History:  Procedure Laterality Date   COLONOSCOPY  05/25/2011   KNEE SURGERY  01/23/1989   left   MULTIPLE TOOTH EXTRACTIONS     TONSILLECTOMY  01/24/1975      Medications: Prior to Admission medications   Medication Sig Start Date End Date Taking? Authorizing Provider  albuterol  (PROVENTIL  HFA;VENTOLIN  HFA) 108 (90 Base) MCG/ACT inhaler Inhale 2 puffs into the lungs every 6 (six) hours as needed for wheezing or shortness of breath. 02/17/18  Yes Drusilla, Sabas RAMAN, MD  amLODipine  (NORVASC ) 2.5 MG tablet Take 2.5 mg by mouth daily.   Yes [provider]  amoxicillin (AMOXIL) 500 MG capsule Take 500 mg by mouth 2 (two) times daily. 08/14/23 08/21/23 Yes [provider]     Family History  Problem Relation Age of Onset   Colon cancer Neg Hx    Stomach cancer Neg Hx    Esophageal cancer Neg Hx    Rectal cancer Neg Hx     Social History   Socioeconomic History   Marital status: Single    Spouse name: Not on file   Number of children: Not on  file   Years of education: Not on file   Highest education level: Not on file  Occupational History   Not on file  Tobacco Use   Smoking status: Every Day    Current packs/day: 1.00    Types: Cigarettes   Smokeless tobacco: Never  Vaping Use   Vaping status: Never Used  Substance and Sexual Activity   Alcohol use: No   Drug use: No   Sexual activity: Not on file  Other Topics Concern   Not on file  Social History Narrative   Not on file   Social Drivers of Health   Financial Resource Strain: Not on file  Food Insecurity: Not on file  Transportation Needs: Not on file  Physical Activity: Not on file  Stress: Not on file  Social Connections: Not on file     Review of Systems: A 12 point ROS discussed and pertinent positives are indicated in the HPI above.  All other systems are negative.   Vital Signs: BP 103/67 (BP Location: Right Arm)   Pulse (!) 116   Temp 98.5 F (36.9 C) (Oral)   Resp 14   Wt 97 lb (44 kg)   SpO2 93%   BMI 12.12 kg/m    Physical Exam Constitutional:      Comments: Cachectic   Cardiovascular:     Rate and Rhythm: Regular rhythm. Tachycardia present.  Pulmonary:  Effort: Tachypnea present.     Breath sounds: Decreased breath sounds present.     Comments: Dyspnea with conversation and exertion Chest:     Chest wall: No tenderness.  Abdominal:     Palpations: Abdomen is soft.     Tenderness: There is no abdominal tenderness.  Skin:    General: Skin is warm and dry.     Capillary Refill: Capillary refill takes less than 2 seconds.  Neurological:     Mental Status: He is alert. He is disoriented.  Psychiatric:        Mood and Affect: Mood normal.        Behavior: Behavior normal.     Imaging: CT HEAD WO CONTRAST ( ) Result Date: 08/16/2023 CLINICAL DATA:  Memory loss of EXAM: CT HEAD WITHOUT CONTRAST TECHNIQUE: Contiguous axial images were obtained from the base of the skull through the vertex without intravenous contrast.  RADIATION DOSE REDUCTION: This exam was performed according to the departmental dose-optimization program which includes automated exposure control, adjustment of the mA and/or kV according to patient size and/or use of iterative reconstruction technique. COMPARISON:  None Available. FINDINGS: Brain: No evidence of acute infarction, hemorrhage, hydrocephalus, extra-axial collection or mass lesion/mass effect. Vascular: Calcific atherosclerosis.  No hyperdense vessel. Skull: No acute fracture. Sinuses/Orbits: Mostly clear sinuses. Left frontal sinus osteoma. No acute orbital findings. Other: No mastoid effusions. IMPRESSION: No evidence of acute intracranial abnormality. Electronically Signed   By: Gilmore GORMAN Molt M.D.   On: 08/16/2023 23:57   CT Angio Chest PE W and/or Wo Contrast Result Date: 08/16/2023 CLINICAL DATA:  Shortness of breath and extra tori wheezing EXAM: CT ANGIOGRAPHY CHEST WITH CONTRAST TECHNIQUE: Multidetector CT imaging of the chest was performed using the standard protocol during bolus administration of intravenous contrast. Multiplanar CT image reconstructions and MIPs were obtained to evaluate the vascular anatomy. RADIATION DOSE REDUCTION: This exam was performed according to the departmental dose-optimization program which includes automated exposure control, adjustment of the mA and/or kV according to patient size and/or use of iterative reconstruction technique. CONTRAST:  75mL OMNIPAQUE  IOHEXOL  350 MG/ML SOLN COMPARISON:  08/16/2023 chest radiograph FINDINGS: Cardiovascular: No filling defect is identified in the pulmonary arterial tree to suggest pulmonary embolus. Aortic and branch vessel atherosclerosis. Mediastinum/Nodes: Paucity of subcutaneous and mediastinal adipose tissues. Mildly enlarged left paratracheal lymph node 1.0 cm in short axis on image 68 series 4. Lungs/Pleura: Mucous causing fluid levels in both mainstem bronchi. Complete occlusion of the right lower lobe  bronchus. Plugging segmental bronchi in the left lower lobe. Consolidation of the entire right lower lobe and right middle lobe, as well as much of the posterior portion of the right upper lobe, with associated air bronchograms. Consolidation and scattered nodularity along with varicoid bronchiectasis in the left lower lobe. Some of the areas of nodularity are probably areas of cystic bronchiectasis. Scattered ill-defined nodules in the left lower lobe measure up to about 1 cm in the long axis. There is scattered small nodules in the left upper lobe, index nodule 7 by 5 mm on image 90 series 5. Background centrilobular emphysema. Moderate right pleural effusion is primarily sub pulmonic. Strictly speaking a right lung mass particularly in the right lower lobe cannot be excluded given the degree airspace opacity and airway truncation. Upper Abdomen: Scattered hypodense hepatic lesions once again favoring cysts, many similar to 05/01/2014. Abdominal aortic atherosclerosis. Nonspecific fluid density lesion of the right kidney upper pole on image 180 series 4 compatible with simple cyst. No  further imaging workup of this lesion is indicated. Musculoskeletal: Thoracic kyphosis. Review of the MIP images confirms the above findings. IMPRESSION: 1. No filling defect is identified in the pulmonary arterial tree to suggest pulmonary embolus. 2. Consolidation of the entire right lower lobe and right middle lobe, as well as much of the posterior portion of the right upper lobe, with associated air bronchograms. Consolidation and scattered nodularity along with varicoid bronchiectasis in the left lower lobe. Some of the areas of nodularity are probably areas of cystic bronchiectasis. Appearance compatible with multilobar pneumonia. Strictly speaking a right lung mass particularly in the right lower lobe cannot be excluded given the degree airspace opacity and airway truncation. 3. Scattered nodules in the left lung. Given the  complex and extensive nature findings in this case, I recommend followup PA and lateral chest radiography in 4 weeks time after appropriate antibiotic therapy to reassess the lungs, and consideration of noncontrast chest CT or PET-CT in 1-3 months to reassess the underlying nodularity. 4. Moderate right pleural effusion is primarily sub pulmonic. 5. Scattered hypodense hepatic lesions once again favoring cysts, much similar to 05/01/2014. 6. Thoracic kyphosis. 7. Aortic Atherosclerosis (ICD10-I70.0) and Emphysema (ICD10-J43.9). Electronically Signed   By: Ryan Salvage M.D.   On: 08/16/2023 08:38   DG Chest Portable 1 View Result Date: 08/16/2023 CLINICAL DATA:  Shortness of breath. EXAM: PORTABLE CHEST 1 VIEW COMPARISON:  02/16/2018 FINDINGS: Lungs are hyperexpanded. Dense consolidative disease is seen in the right mid and lower lung with some probable associated component of collapse. Associated right pleural effusion evident. There is patchy airspace disease at the left base. Interstitial markings are diffusely coarsened with chronic features. No acute bony abnormality. IMPRESSION: 1. Dense consolidative disease in the right mid and lower lung with some probable associated component of collapse. Imaging features compatible with pneumonia. Associated right pleural effusion. Consider chest CT with contrast to exclude central obstructing neoplasm. 2. Patchy airspace disease at the left base. Electronically Signed   By: Camellia Candle M.D.   On: 08/16/2023 06:56    Labs:  CBC: Recent Labs    08/16/23 0538 08/17/23 0551  WBC 11.7* 10.2  HGB 13.6 13.1  HCT 41.7 39.9  PLT 456* 451*    COAGS: No results for input(s): INR, APTT in the last 8760 hours.  BMP: Recent Labs    08/16/23 0538 08/17/23 0551  NA 136 136  K 4.5 3.5  CL 99 101  CO2 23 23  GLUCOSE 84 118*  BUN 16 24*  CALCIUM 8.9 8.5*  CREATININE 0.75 0.80  GFRNONAA >60 >60    LIVER FUNCTION TESTS: Recent Labs     08/16/23 0538  BILITOT 1.3*  AST 22  ALT 14  ALKPHOS 58  PROT 6.0*  ALBUMIN 2.1*    TUMOR MARKERS: No results for input(s): AFPTM, CEA, CA199, CHROMGRNA in the last 8760 hours.  Assessment and Plan:  Request for  image guided right chest tube insertion approved by Dr. Jenna No contraindications for procedure identified in ROS, physical exam, or review of pre-sedation considerations. Labs reviewed and within acceptable range. INR pending. 7/24 CT imaging available and reviewed Afebrile, tachycardic Lovenox  held this AM  Critical care MD Leita Gaskins aware of IR plan to insert chest tube today.   Patient agrees with plan but is oriented to self, place, situation. His brother was contacted for consent.     Risks and benefits of chest tube placement were discussed with the patient including bleeding, infection, damage  to adjacent structures, malfunction of the tube requiring additional procedures and sepsis.  All of the patient's questions were answered, patient is agreeable to proceed. Consent signed and in chart.   Thank you for allowing our service to participate in Ason Heslin 's care.    Electronically Signed: Laymon Coast, NP   08/17/2023, 10:10 AM     I spent a total of 20 Minutes    in face to face in clinical consultation, greater than 50% of which was counseling/coordinating care for image guided chest tube insertion.   (A copy of this note was sent to the referring provider and the time of visit.)

## 2023-08-17 NOTE — Progress Notes (Signed)
 Initial Nutrition Assessment  DOCUMENTATION CODES:   Severe malnutrition in context of chronic illness, Underweight  INTERVENTION:  Depending on results of MBS:  When diet ordered/enteral ordered recommend monitoring for refeeding. Pt is at risk for refeeding syndrome given severe malnutrition and cachexia. Monitor magnesium  and phosphorus daily x 4 days, MD to replete as needed. 100mg  thiamine x 5 days  If PO intake recommended: Ensure Plus High Protein po BID, each supplement provides 350 kcal and 20 grams of protein.  Liberalized regular diet to increase options available and promote intake that meets pt's calorie and protein needs  Magic cup TID with meals, each supplement provides 290 kcal and 9 grams of protein   If NPO recommended by SLP:  Recommend placing OG/NG/Cortrak and initiating enteral tube feeds  Initiate tube feeding via OG/NG/Cortrak: Initiate tube feeds at 25 ml/h and increase 10ml every 12 h until goal rate of 29ml/h is reached  Osmolite 1.5 at 45 ml/h (1080 ml per day)  Prosource TF20 60 ml daily  Provides 1700 kcal, 87 gm protein, 822 ml free water daily   NUTRITION DIAGNOSIS:   Severe Malnutrition related to chronic illness (COPD, arthritis) as evidenced by severe fat depletion, severe muscle depletion, energy intake < or equal to 75% for > or equal to 1 month, percent weight loss.  GOAL:   Patient will meet greater than or equal to 90% of their needs  MONITOR:   PO intake, Supplement acceptance  REASON FOR ASSESSMENT:   Consult Assessment of nutrition requirement/status  ASSESSMENT:   Pt with hx of COPD, HTN, arthritis, and tobacco use (recently quit). Admitted for SOB, diagnosed with community acquired pneumonia.  7/24 admitted 7/25 IR for CT placement, SLP bedside eval recommend NPO with follow up MBS after CT placement  Pt getting chest tube placed today 7/25. Pt will need MBS conducted for further swallow eval to determine swallow  safety, will continue to monitor diet order and SLP recommendations for diet. SLP bedside eval recommended NPO until MBS could be completed.  Pt awake and alert during assessment. Pt seemed fairly oriented but memory PTA questionable. Pt reports he is currently hungry, but knows he is not able to eat before CT placement. He reports no GI discomforts at this time. PTA, pt reports low intake for months leading up to admission. Pt reports eating 1-2 meals per day due to being very busy and forgetting to eat. He reports he would eat breakfast some days and would consist of eggs and sausage. He usually skipped lunch then would eat dinner. Reports eating chicken and rice for most dinners. He reports he stopped smoking 3 months ago and has been working on improving his appetite since then but appetite remains fairly poor. Pt admits to coughing when eating but could give specifics.   Pt admits to recent wt loss and believes he has lost about 30# since Christmas which is when brother attributed wt loss beginning. There is limited wt hx in chart to verify report. This amount of wt loss in 7 months is significant and aligns with findings of nutrition focused physical exam. Pt is at high refeeding risk given severe deficits.   Pt reports using wheelchair and rolling walker/cane for years prior to admission due to arthritis which may contribute to muscle loss in lower extremities but deficits are so severe suspect malnutrition is also contributing to losses.   NUTRITION - FOCUSED PHYSICAL EXAM:  Flowsheet Row Most Recent Value  Orbital Region Severe depletion  Upper Arm Region Severe depletion  Thoracic and Lumbar Region Severe depletion  Buccal Region Severe depletion  Temple Region Severe depletion  Clavicle Bone Region Severe depletion  Clavicle and Acromion Bone Region Severe depletion  Scapular Bone Region Severe depletion  Dorsal Hand Severe depletion  Patellar Region Severe depletion  Anterior Thigh  Region Severe depletion  Posterior Calf Region Severe depletion  Edema (RD Assessment) None  Hair Reviewed  Eyes Reviewed  Mouth Reviewed  Skin Reviewed  Nails Reviewed    Diet Order:   Diet Order             Diet NPO time specified  Diet effective midnight                   EDUCATION NEEDS:   Education needs have been addressed  Skin:  Skin Assessment: Reviewed RN Assessment  Last BM:  per pt report, 7/24 PTA  Height:   Ht Readings from Last 1 Encounters:  08/17/23 6' 3 (1.905 m)    Weight:   Wt Readings from Last 1 Encounters:  08/17/23 44 kg    Ideal Body Weight:  89.1 kg  BMI:  Body mass index is 12.12 kg/m.  Estimated Nutritional Needs:   Kcal:  1600-1900  Protein:  70-90g  Fluid:  1.6-1.9L   Josette Glance, MS, RDN, LDN Clinical Dietitian I Please reach out via secure chat

## 2023-08-17 NOTE — Progress Notes (Signed)
 [facility] Progress Note Patient: Eural Holzschuh FMW:[id number] DOB: 1955-03-03 DOA: 08/16/2023  DOS: the patient was seen and examined on 08/17/2023  Brief Hospital Course: Patient with PMH of HTN, smoker, failure to thrive, presented to the hospital with complaints of cough and shortness of breath. Found to have right lower lobe pneumonia as well as possible parapneumonic effusion. Pulmonary consulted. Underwent IR guided chest tube placement on 7/25.  Assessment and Plan: Right lower lobe pneumonia. Right pleural effusion with loculation. Possible lung mass. Appreciate pulmonary consultation. Suspect most likely secondary to pneumonia. Receiving IV antibiotics. Underwent IR.  Chest tube inserted. Will monitor output. Continue cough therapy. COVID and RVP negative.  Acute metabolic encephalopathy. Likely in the setting of poor p.o. intake as well as infection. For now monitor clinically.  Weight loss. Adult failure to thrive. Underweight. Severe protein calorie malnutrition. Body mass index is 12.12 kg/m.  Dietitian following. Will monitor. Placing the patient at high risk for poor outcome.  Former smoker. Quit 3 months ago. Monitor for now.   Subjective: Feeling better.  Continues to have shortness of breath.  Cough improving.  No chest pain.  Physical Exam: Basilar crackles.  Right sided decreased air entry. S1-S2 present Bowel sound present No edema.  Data Reviewed: I have Reviewed nursing notes, Vitals, and Lab results. Since last encounter, pertinent lab results CBC and BMP   . I have ordered test including CBC and BMP  . I have discussed pt's care plan and test results with pulmonary  .   Disposition: Status is: Inpatient Remains inpatient appropriate because: Monitor for improvement in chest tube output status  enoxaparin  (LOVENOX ) injection 40 mg Start: 08/16/23 1100   Family Communication: Discussed with brother on the phone Level of care:  Telemetry Medical continue Vitals:   08/17/23 1605 08/17/23 1610 08/17/23 1625 08/17/23 1645  BP: 128/80 116/74 (!) 117/96 (!) 131/90  Pulse: (!) 106 (!) 107 (!) 111 (!) 117  Resp: (!) 28 (!) 27 (!) 22 20  Temp:    97.9 F (36.6 C)  TempSrc:    Axillary  SpO2: 96% 95% 93% 90%  Weight:      Height:         Author: Yetta Blanch, MD 08/17/2023 6:45 PM  Please look on [email or web address] to find out who is on call.

## 2023-08-18 ENCOUNTER — Inpatient Hospital Stay (HOSPITAL_COMMUNITY)

## 2023-08-18 DIAGNOSIS — J90 Pleural effusion, not elsewhere classified: Secondary | ICD-10-CM | POA: Diagnosis not present

## 2023-08-18 DIAGNOSIS — J869 Pyothorax without fistula: Secondary | ICD-10-CM | POA: Diagnosis not present

## 2023-08-18 DIAGNOSIS — J449 Chronic obstructive pulmonary disease, unspecified: Secondary | ICD-10-CM | POA: Diagnosis not present

## 2023-08-18 DIAGNOSIS — J189 Pneumonia, unspecified organism: Secondary | ICD-10-CM | POA: Diagnosis not present

## 2023-08-18 LAB — CBC
HCT: 38.6 % — ABNORMAL LOW (ref 39.0–52.0)
Hemoglobin: 12.7 g/dL — ABNORMAL LOW (ref 13.0–17.0)
MCH: 27.8 pg (ref 26.0–34.0)
MCHC: 32.9 g/dL (ref 30.0–36.0)
MCV: 84.5 fL (ref 80.0–100.0)
Platelets: 418 K/uL — ABNORMAL HIGH (ref 150–400)
RBC: 4.57 MIL/uL (ref 4.22–5.81)
RDW: 15.3 % (ref 11.5–15.5)
WBC: 12.2 K/uL — ABNORMAL HIGH (ref 4.0–10.5)
nRBC: 0 % (ref 0.0–0.2)

## 2023-08-18 LAB — BODY FLUID CELL COUNT WITH DIFFERENTIAL
Eos, Fluid: 6 %
Lymphs, Fluid: 48 %
Monocyte-Macrophage-Serous Fluid: 8 % — ABNORMAL LOW (ref 50–90)
Neutrophil Count, Fluid: 38 % — ABNORMAL HIGH (ref 0–25)
Total Nucleated Cell Count, Fluid: 710 uL (ref 0–1000)

## 2023-08-18 LAB — LACTATE DEHYDROGENASE, PLEURAL OR PERITONEAL FLUID: LD, Fluid: 211 U/L — ABNORMAL HIGH (ref 3–23)

## 2023-08-18 LAB — PROTEIN, PLEURAL OR PERITONEAL FLUID: Total protein, fluid: <3 g/dL

## 2023-08-18 LAB — GLUCOSE, PLEURAL OR PERITONEAL FLUID: Glucose, Fluid: 93 mg/dL

## 2023-08-18 MED ORDER — SODIUM CHLORIDE (PF) 0.9 % IJ SOLN
10.0000 mg | Freq: Once | INTRAMUSCULAR | Status: AC
  Administered 2023-08-18: 10 mg via INTRAPLEURAL
  Filled 2023-08-18: qty 10

## 2023-08-18 MED ORDER — STERILE WATER FOR INJECTION IJ SOLN
5.0000 mg | Freq: Once | RESPIRATORY_TRACT | Status: AC
  Administered 2023-08-18: 5 mg via INTRAPLEURAL
  Filled 2023-08-18: qty 5

## 2023-08-18 MED ORDER — SODIUM CHLORIDE 0.9% FLUSH
10.0000 mL | Freq: Three times a day (TID) | INTRAVENOUS | Status: DC
  Administered 2023-08-18 – 2023-08-28 (×30): 10 mL via INTRAPLEURAL

## 2023-08-18 MED ORDER — SENNOSIDES-DOCUSATE SODIUM 8.6-50 MG PO TABS
1.0000 | ORAL_TABLET | Freq: Two times a day (BID) | ORAL | Status: DC
  Administered 2023-08-18 – 2023-08-29 (×22): 1 via ORAL
  Filled 2023-08-18 (×22): qty 1

## 2023-08-18 NOTE — Procedures (Signed)
 Pleural Fibrinolytic Administration Procedure Note  Fred Wise  [id number]  [birth year]  Date:08/18/23  Time:10:53 AM   Provider Performing:Kashlynn Kundert V. Lavonya Hoerner   Procedure: Pleural Fibrinolysis Initial day (67438)  Indication(s) Fibrinolysis of complicated pleural effusion  Consent Risks of the procedure as well as the alternatives and risks of each were explained to the patient and/or caregiver.  Consent for the procedure was obtained.   Anesthesia None   Time Out Verified patient identification, verified procedure, site/side was marked, verified correct patient position, special equipment/implants available, medications/allergies/relevant history reviewed, required imaging and test results available.   Sterile Technique Hand hygiene, gloves   Procedure Description Existing pleural catheter was cleaned and accessed in sterile manner. Pleural fluid sent for studies. 10mg  of tPA in 30cc of saline and 5mg  of dornase in 30cc of sterile water  were injected into pleural space using existing pleural catheter.  Catheter will be clamped for 1 hour and then placed back to suction.   Complications/Tolerance None; patient tolerated the procedure well.   EBL None   Specimen(s) None

## 2023-08-18 NOTE — Progress Notes (Signed)
 [facility] Progress Note Patient: Fred Wise FMW:[id number] DOB: 1955/05/11 DOA: 08/16/2023  DOS: the patient was seen and examined on 08/18/2023  Brief Hospital Course: Patient with PMH of HTN, smoker, failure to thrive, presented to the hospital with complaints of cough and shortness of breath. Found to have right lower lobe pneumonia as well as possible parapneumonic effusion. Pulmonary consulted. Underwent IR guided chest tube placement on 7/25.  Assessment and Plan: Right lower lobe pneumonia. Right pleural effusion with loculation. Possible lung mass. Post chest tube insertion pneumothorax Appreciate pulmonary consultation. Suspect most likely secondary to pneumonia versus.  Malignancy Receiving IV antibiotics. Underwent IR guided chest tube insertion on 7/25. Pleural effusion analysis shows elevated LDH meeting lights criteria..  To fluid. All the total protein is negative. Will monitor output. Chest x-ray after the chest tube insertion shows evidence of moderate pneumothorax concerning for trapped lung. Pleural lytic therapy done by pulmonary on 7/26.  Dysphagia. SLP evaluation. MBS performed. Dysphagia 2 diet recommended.  Acute metabolic encephalopathy. Likely in the setting of poor p.o. intake as well as infection. Mentation improving.  No focal deficit.  Weight loss. Adult failure to thrive. Underweight. Severe protein calorie malnutrition. Body mass index is 12.12 kg/m.  Dietitian following. Will monitor. Placing the patient at high risk for poor outcome.  Former smoker. Quit 3 months ago. Monitor for now.   Subjective: Denies any acute complaint.  Pain well-controlled.  Cough improving.  Shortness of breath improving.  Physical Exam: Basal crackles. S1-S2 present. Bowel sound present. No edema. Alert awake and oriented x 3.  Data Reviewed: I have Reviewed nursing notes, Vitals, and Lab results. Since last encounter, pertinent lab results  CBC and BMP   . I have ordered test including CBC and BMP  . I have discussed pt's care plan and test results with pulmonary  . I have ordered imaging chest x-ray  .   Disposition: Status is: Inpatient Remains inpatient appropriate because: Monitor for improvement in respiratory status  enoxaparin  (LOVENOX ) injection 40 mg Start: 08/16/23 1100   Family Communication: Brother at bedside Level of care: Telemetry Medical continue Vitals:   08/17/23 1944 08/18/23 0600 08/18/23 0817 08/18/23 1500  BP: 117/73 95/66 (!) 89/60 118/87  Pulse: (!) 121 (!) 104    Resp: 17  17 16   Temp: 99 F (37.2 C) 98 F (36.7 C) 98 F (36.7 C) 98.2 F (36.8 C)  TempSrc: Oral Oral Oral Oral  SpO2: 93% 94% 95% 94%  Weight:      Height:         Author: Yetta Blanch, MD 08/18/2023 5:28 PM  Please look on [email or web address] to find out who is on call.

## 2023-08-18 NOTE — Progress Notes (Addendum)
 NAME:  Fred Wise, MRN:  [id number], DOB:  12/12/1955, LOS: 2 ADMISSION DATE:  08/16/2023, CONSULTATION DATE: 7/24 REFERRING MD: MD Claudene with [facility] CHIEF COMPLAINT: Shortness of breath  History of Present Illness:  Pt is a 68 yr old male with pmhx significant of COPD, HTN, arthritis, and tobacco abuse (quit smoking 3 months ago) presents by EMS from home to [facility] emergency department for shortness of breath, which has been going on for about over a week per her report. Initial workup in the emergency department patient was given Solu-Medrol  125 mg IV and O2 saturations were low 90s on room air, send not needing oxygen  at this standpoint.  Noted to patient to have tachypnea, tachycardia, and blood pressure mainly normotensive to slightly hypertensive.  Initial lab work revealed sodium of 136, potassium of 4.5, creatinine 0.75, BUN 16, WBC of 11.7, hemoglobin 13.6, BNP 69, troponin high-sensitivity 11, procalcitonin 0.57  Chest x-ray-indicated dense consolidation of the right lung along with right pleural effusion CT angio also completed-negative for PE, but concern for multilobar pneumonia, moderate side pleural effusion and and scattered nodules within the left lung-noted to be complex/extensive recommendations by radiologist for repeat imaging within 4 weeks.   PCCM consulted for possible bronc/Thora in regards to CT scan findings.  Upon assessing patient, history was difficult to obtain from patient due to orientation being off.  Patient could tell us  that he was in the hospital but not but not and not really why that he was in the hospital.  Most history was obtained by brother at bedside along with family friend.  Per patient's brother, patient started having increased phlegm over the last week which was thick and white in consistency.  Also brother endorsed that patient has been coughing more often as well as had significant weight loss since Christmas but unable to tell amount of weight  lost.  Brother believes that patient has quit smoking over the last 2 to 3 months.  Patient's brother and family, could only tell me that patient may have been smoking for at least 1 pack a day for 30 to 40 years.  Brother and family member denies patient having chills, fever, any sick contacts, and or significant bleeding.  Brother also stated that patient did drive to doctor's appointment this past Tuesday and fell in the parking lot.  Not able to determine if this was witnessed or not.  In regards to the mentation/lack of orientation, patient's brother stated that mentation has been getting worse over the last couple months  Pertinent  Medical History   Past Medical History:  Diagnosis Date   Arthritis    knees, uses wheelchair   Hypertension      Significant Hospital Events: Including procedures, antibiotic start and stop dates in addition to other pertinent events   7/24: pccm consult for loculated effusion  7/25:  IR pigtail CT placement >> serosanguinous fluid  Interim History / Subjective:   Denies pain Dyspnea improved About 1 L serosanguineous fluid in Pleur-evac  Objective    Blood pressure (!) 89/60, pulse (!) 104, temperature 98 F (36.7 C), temperature source Oral, resp. rate 17, height 6' 3 (1.905 m), weight 44 kg, SpO2 95%.        Intake/Output Summary (Last 24 hours) at 08/18/2023 1012 Last data filed at 08/18/2023 9367 Gross per 24 hour  Intake 580 ml  Output 860 ml  Net -280 ml   Filed Weights   08/16/23 1647 08/17/23 1152  Weight: 44  kg 44 kg    Examination: General: older male, cachectic, no distress , on room air HENT: ncat, perrla, mmm, room air Lungs: Decreased breath sounds on right, no accessory muscle use, no rhonchi Cardiovascular: s1s2 no m/r/g Abdomen: flat, soft  Extremities: cachectic  Neuro: alert, oriented to place and time.  Grossly nonfocal GU: Deferred  Labs show decreased leukocytosis, mild hypokalemia, low procalcitonin Chest  x-ray shows improved aeration right lower lobe, tracheal shift to right, pneumothorax on right  Resolved problem list   Assessment and Plan  Multilobar pneumonia/CAP Concern for malignancy Weight loss - unknown over months  Right parapneumonic effusion status post pigtail >> right pneumothorax?  Indicating trapped lung COPD CXR with right pleural effusion  CTA chest negative for PE. Does show RLL pneumonia, loculated R effusion. He has emphysema on imaging.  WBC 11.7, procal 0.57. HIV negative. urine negative for strep/Legionella COVID/flu/RSV negative.  - Unfortunately fluid was not sent for studies, will send today - azithromycin  and rocephin  for CAP coverage  - f/u sputum culture, pleural studies including cytology -Will instill lytics today and evaluate response by CXR/ output - con't mucinex , duonebs,  - flutter valve, IS  - repeat imaging to follow-up to resolution  right pneumothorax?  Indicating trapped lung -Keep pigtail to suction  Other Hospital problems managed by primary Altered mental status -resolved, CT head  negative.  Hypertension Arthritis Tobacco abuse  Brother updated at bedside  Best Practice (right click and Reselect all SmartList Selections daily)   Per Primary   Labs   CBC: Recent Labs  Lab 08/16/23 0538 08/17/23 0551  WBC 11.7* 10.2  NEUTROABS 9.0*  --   HGB 13.6 13.1  HCT 41.7 39.9  MCV 86.3 86.2  PLT 456* 451*    Basic Metabolic Panel: Recent Labs  Lab 08/16/23 0538 08/17/23 0551  NA 136 136  K 4.5 3.5  CL 99 101  CO2 23 23  GLUCOSE 84 118*  BUN 16 24*  CREATININE 0.75 0.80  CALCIUM 8.9 8.5*   GFR: Estimated Creatinine Clearance: 55 mL/min (by C-G formula based on SCr of 0.8 mg/dL). Recent Labs  Lab 08/16/23 0538 08/16/23 1246 08/17/23 0551  PROCALCITON  --  0.57  --   WBC 11.7*  --  10.2    Liver Function Tests: Recent Labs  Lab 08/16/23 0538  AST 22  ALT 14  ALKPHOS 58  BILITOT 1.3*  PROT 6.0*  ALBUMIN  2.1*   No results for input(s): LIPASE, AMYLASE in the last 168 hours. No results for input(s): AMMONIA in the last 168 hours.  ABG No results found for: PHART, PCO2ART, PO2ART, HCO3, TCO2, ACIDBASEDEF, O2SAT   Coagulation Profile: Recent Labs  Lab 08/17/23 0923  INR 1.1    Cardiac Enzymes: No results for input(s): CKTOTAL, CKMB, CKMBINDEX, TROPONINI in the last 168 hours.  HbA1C: No results found for: HGBA1C  CBG: No results for input(s): GLUCAP in the last 168 hours.   Harden ROCKFORD Jude, MD [facility] 08/18/23 10:12 AM  Please see [email or web address] for pager details.  From 7A-7P if no response, please call [phone] After hours, please call ELink [phone]

## 2023-08-18 NOTE — Procedures (Signed)
 Modified Barium Swallow Study  Patient Details  Name: Fred Wise MRN: [id number] Date of Birth: 01-20-1956  Today's Date: 08/18/2023  Modified Barium Swallow completed.  Full report located under Chart Review in the Imaging Section.  History of Present Illness Pt is a 68 yr old male who presented by EMS from home to [facility] emergency department for shortness of breath, which has been going on for about over a week per her report. CT Chest 7/24: 2. Consolidation of the entire right lower lobe and right middle  lobe, as well as much of the posterior portion of the right upper  lobe, with associated air bronchograms. Consolidation and scattered  nodularity along with varicoid bronchiectasis in the left lower  lobe. Some of the areas of nodularity are probably areas of cystic  bronchiectasis. Appearance compatible with multilobar pneumonia.  Strictly speaking a right lung mass particularly in the right lower  lobe cannot be excluded given the degree airspace opacity and airway  truncation.  3. Scattered nodules in the left lung. Given the complex and  extensive nature findings in this case, I recommend followup PA and  lateral chest radiography in 4 weeks time after appropriate  antibiotic therapy to reassess the lungs, and consideration of  noncontrast chest CT or PET-CT in 1-3 months to reassess the  underlying nodularity. Pt with pmhx significant of COPD, HTN, arthritis, and tobacco abuse (quit smoking 3 months ago).   Clinical Impression Patient presents with a primary pharyngoesophageal dysphagia as per this MBS. Swallows were initated at level of vallecular sinus for majority of PO's, however with straw sips of thin liquids, swallow initiated at pyriform sinus. Anterior hyoid excursion and laryngeal elevation were all partial in completion and laryngeal vestibule closure was incomplete. PES distention and duration was partial in completion, leading to moderate amount of pyriform sinus  residuals s/p initial swallows. In addition, prominent cricopharyngeal bar was observed (no radiologist present to confirm) which resulted in reduced flow of barium. Patient was not sensate to pharyngeal residuals but subsequent swallows did help to clear some but not all of residuals. Penetration of thin liquid barium occured above level of vocal cords which did not consistently clear vestibule (PAS 3, 2) and penetration above vocal cords that did clear laryngeal vestibule (PAS 2) occured with nectar thick liquids. During esophageal sweep, slow movement of barium through distal esophagus as well as retrograde flow of barium below PES observed. He exibited frequent belching thoughout this study. SLP recommending PO diet of Dys 2 (minced solids),  thin liquids, swallow 2-3 times with solids and liquids, follow GERD precautions and no straws. Factors that may increase risk of adverse event in presence of aspiration Noe & Lianne 2021): Poor general health and/or compromised immunity;Frail or deconditioned  Swallow Evaluation Recommendations Recommendations: PO diet PO Diet Recommendation: Dysphagia 2 (Finely chopped);Thin liquids (Level 0) Liquid Administration via: Cup;No straw Medication Administration: Whole meds with puree Supervision: Patient able to self-feed;Intermittent supervision/cueing for swallowing strategies Swallowing strategies  : Slow rate;Small bites/sips;Follow solids with liquids;Multiple dry swallows after each bite/sip Postural changes: Stay upright 30-60 min after meals;Position pt fully upright for meals Oral care recommendations: Oral care BID (2x/day)    Norleen IVAR Blase, MA, CCC-SLP Speech Therapy

## 2023-08-18 NOTE — Progress Notes (Signed)
 Referring Provider(s): Mr. Fonda Sharps, NP   Supervising Physician: Luverne Aran  Patient Status:  [facility] - In-pt  Chief Complaint: Loculated R pleural effusion   Brief History:  Patient was admitted after presenting for shortness of breath. Workup reveals R lung infiltrate, possible lung mass, and a loculated effusion. IR consulted, recommended pigtail chest tube rather than thoracentesis d/t loculation. Chest tube placed by Dr. Jenna on 7/25.  Subjective:  Patient alert and laying in bed, calm.  Currently without any significant complaints. He states he feels well, and is cheerful/ happy. Patient denies any fevers, headache, chest pain, SOB, cough, abdominal pain, nausea, vomiting or bleeding.     Allergies: Patient has no known allergies.  Medications: Prior to Admission medications   Medication Sig Start Date End Date Taking? Authorizing Provider  albuterol  (PROVENTIL  HFA;VENTOLIN  HFA) 108 (90 Base) MCG/ACT inhaler Inhale 2 puffs into the lungs every 6 (six) hours as needed for wheezing or shortness of breath. 02/17/18  Yes Drusilla, Sabas RAMAN, MD  amLODipine  (NORVASC ) 2.5 MG tablet Take 2.5 mg by mouth daily.   Yes [provider]  amoxicillin (AMOXIL) 500 MG capsule Take 500 mg by mouth 2 (two) times daily. 08/14/23 08/21/23 Yes [provider]     Vital Signs: BP (!) 89/60 (BP Location: Right Arm)   Pulse (!) 104   Temp 98 F (36.7 C) (Oral)   Resp 17   Ht 6' 3 (1.905 m)   Wt 97 lb (44 kg)   SpO2 95%   BMI 12.12 kg/m   Physical Exam Constitutional:      Appearance: Normal appearance.  Cardiovascular:     Rate and Rhythm: Normal rate.     Pulses: Normal pulses.  Pulmonary:     Effort: Pulmonary effort is normal.     Breath sounds: Normal breath sounds.     Comments: Right lateral chest tube appropriately dressed. Dressing is clean, dry, intact. Tube incision site non-tender, without evidence of infection. Retaining suture and Stat Lock in  place.  Good serosanguinous output in collection cassette, approximately 1150 mL. Water  seal.  Skin:    General: Skin is warm and dry.  Neurological:     Mental Status: He is alert and oriented to person, place, and time.  Psychiatric:        Mood and Affect: Mood normal.        Behavior: Behavior normal.      Labs:  CBC: Recent Labs    08/16/23 0538 08/17/23 0551  WBC 11.7* 10.2  HGB 13.6 13.1  HCT 41.7 39.9  PLT 456* 451*    COAGS: Recent Labs    08/17/23 0923  INR 1.1    BMP: Recent Labs    08/16/23 0538 08/17/23 0551  NA 136 136  K 4.5 3.5  CL 99 101  CO2 23 23  GLUCOSE 84 118*  BUN 16 24*  CALCIUM 8.9 8.5*  CREATININE 0.75 0.80  GFRNONAA >60 >60    LIVER FUNCTION TESTS: Recent Labs    08/16/23 0538  BILITOT 1.3*  AST 22  ALT 14  ALKPHOS 58  PROT 6.0*  ALBUMIN 2.1*    Assessment and Plan: Patient was admitted after presenting for shortness of breath. Workup reveals R lung infiltrate, possible lung mass, and a loculated effusion. IR consulted, recommended pigtail chest tube rather than thoracentesis d/t loculation. Chest tube placed by Dr. Jenna on 7/25.  Chest tube site non-tender, appropriately dressed. Retaining suture/StatLock in place. Good  serosanguinous output in cassette (~1150 mL). To water  seal. Patient breathing well, without complaints. CCM will continue to follow chest tube - much appreciated.  IR will sign off. Please re-consult with any further concerns    Thank you for this interesting consult.  I greatly enjoyed meeting Fred Wise and look forward to participating in their care.   Electronically Signed: Carlin DELENA Griffon, PA-C 08/18/2023, 2:10 PM     I spent a total of 15 Minutes at the the patient's bedside AND on the patient's hospital floor or unit, greater than 50% of which was counseling/coordinating care for loculated Right-sided pleural effusion s/p chest tube placement.

## 2023-08-18 NOTE — Plan of Care (Signed)

## 2023-08-19 ENCOUNTER — Inpatient Hospital Stay (HOSPITAL_COMMUNITY)

## 2023-08-19 DIAGNOSIS — J918 Pleural effusion in other conditions classified elsewhere: Secondary | ICD-10-CM | POA: Diagnosis not present

## 2023-08-19 DIAGNOSIS — J449 Chronic obstructive pulmonary disease, unspecified: Secondary | ICD-10-CM | POA: Diagnosis not present

## 2023-08-19 DIAGNOSIS — J189 Pneumonia, unspecified organism: Secondary | ICD-10-CM | POA: Diagnosis not present

## 2023-08-19 DIAGNOSIS — J869 Pyothorax without fistula: Secondary | ICD-10-CM | POA: Diagnosis not present

## 2023-08-19 DIAGNOSIS — J90 Pleural effusion, not elsewhere classified: Secondary | ICD-10-CM | POA: Diagnosis not present

## 2023-08-19 LAB — CBC
HCT: 41 % (ref 39.0–52.0)
Hemoglobin: 13.6 g/dL (ref 13.0–17.0)
MCH: 28 pg (ref 26.0–34.0)
MCHC: 33.2 g/dL (ref 30.0–36.0)
MCV: 84.5 fL (ref 80.0–100.0)
Platelets: 409 K/uL — ABNORMAL HIGH (ref 150–400)
RBC: 4.85 MIL/uL (ref 4.22–5.81)
RDW: 15.6 % — ABNORMAL HIGH (ref 11.5–15.5)
WBC: 11.1 K/uL — ABNORMAL HIGH (ref 4.0–10.5)
nRBC: 0 % (ref 0.0–0.2)

## 2023-08-19 LAB — BASIC METABOLIC PANEL WITH GFR
Anion gap: 11 (ref 5–15)
BUN: 15 mg/dL (ref 8–23)
CO2: 24 mmol/L (ref 22–32)
Calcium: 8.7 mg/dL — ABNORMAL LOW (ref 8.9–10.3)
Chloride: 99 mmol/L (ref 98–111)
Creatinine, Ser: 0.57 mg/dL — ABNORMAL LOW (ref 0.61–1.24)
GFR, Estimated: >60 mL/min (ref >60)
Glucose, Bld: 91 mg/dL (ref 70–99)
Potassium: 4.5 mmol/L (ref 3.5–5.1)
Sodium: 134 mmol/L — ABNORMAL LOW (ref 135–145)

## 2023-08-19 LAB — PHOSPHORUS: Phosphorus: 2.7 mg/dL (ref 2.5–4.6)

## 2023-08-19 LAB — MAGNESIUM: Magnesium: 2.1 mg/dL (ref 1.7–2.4)

## 2023-08-19 MED ORDER — ACETAMINOPHEN 500 MG PO TABS
1000.0000 mg | ORAL_TABLET | Freq: Three times a day (TID) | ORAL | Status: DC
  Administered 2023-08-19 – 2023-08-29 (×29): 1000 mg via ORAL
  Filled 2023-08-19 (×29): qty 2

## 2023-08-19 MED ORDER — ORAL CARE MOUTH RINSE
15.0000 mL | OROMUCOSAL | Status: DC | PRN
Start: 2023-08-19 — End: 2023-08-29

## 2023-08-19 NOTE — Plan of Care (Signed)

## 2023-08-19 NOTE — Plan of Care (Signed)
  Problem: Clinical Measurements: Goal: Ability to maintain clinical measurements within normal limits will improve Outcome: Progressing Goal: Will remain free from infection Outcome: Progressing Goal: Diagnostic test results will improve Outcome: Progressing Goal: Respiratory complications will improve Outcome: Progressing Goal: Cardiovascular complication will be avoided Outcome: Progressing   Problem: Coping: Goal: Level of anxiety will decrease Outcome: Progressing   Problem: Pain Managment: Goal: General experience of comfort will improve and/or be controlled Outcome: Progressing   Problem: Safety: Goal: Ability to remain free from injury will improve Outcome: Progressing

## 2023-08-19 NOTE — Progress Notes (Signed)
 [facility] Progress Note Patient: Fred Wise FMW:[id number] DOB: September 25, 1955 DOA: 08/16/2023  DOS: the patient was seen and examined on 08/19/2023  Brief Hospital Course: Patient with PMH of HTN, smoker, failure to thrive, presented to the hospital with complaints of cough and shortness of breath. Found to have right lower lobe pneumonia as well as possible parapneumonic effusion. Pulmonary consulted. Underwent IR guided chest tube placement on 7/25.  Assessment and Plan: Right lower lobe pneumonia. Right pleural effusion with loculation. Possible lung mass. Post chest tube insertion pneumothorax Appreciate pulmonary consultation. Suspect most likely secondary to pneumonia versus.  Malignancy Receiving IV antibiotics. Underwent IR guided chest tube insertion on 7/25. Pleural effusion analysis shows elevated LDH meeting lights criteria..  To fluid. All the total protein is negative. Will monitor output.  Remains significant at 750 mL. Chest x-ray shows unchanged hydropneumothorax. Concerning for trapped lung. Pleural lytic therapy done by pulmonary on 7/26.  Dysphagia. SLP evaluation. MBS performed. Dysphagia 2 diet recommended.  Acute metabolic encephalopathy. Likely in the setting of poor p.o. intake as well as infection. Mentation improving.  No focal deficit.  Weight loss. Adult failure to thrive. Underweight. Severe protein calorie malnutrition. Body mass index is 12.12 kg/m.  Dietitian following. Will monitor. Placing the patient at high risk for poor outcome.  Former smoker. Quit 3 months ago. Monitor for now.   Subjective: Denies any acute complaint.  Oral intake is adequate.  No fever no chills.  Physical Exam: Improving air movement on right side. S1-S2 present No edema.  Data Reviewed: I have Reviewed nursing notes, Vitals, and Lab results. Since last encounter, pertinent lab results CBC and BMP   . I have ordered test including CBC and BMP  .    Disposition: Status is: Inpatient Remains inpatient appropriate because: Monitor for improvement in respiratory symptoms and chest tube output  enoxaparin  (LOVENOX ) injection 40 mg Start: 08/16/23 1100   Family Communication: No one at bedside Level of care: Telemetry Medical continue Vitals:   08/18/23 2200 08/19/23 0440 08/19/23 0824 08/19/23 1554  BP: 110/68 102/68 112/79 129/79  Pulse: (!) 103 (!) 104 (!) 111 (!) 109  Resp: 16 18 15 16   Temp: 98.3 F (36.8 C) 98.7 F (37.1 C) 97.6 F (36.4 C) 98.1 F (36.7 C)  TempSrc:  Oral Oral Oral  SpO2: 94% 94% 92% 93%  Weight:      Height:         Author: Yetta Blanch, MD 08/19/2023 7:31 PM  Please look on [email or web address] to find out who is on call.

## 2023-08-20 ENCOUNTER — Inpatient Hospital Stay (HOSPITAL_COMMUNITY)

## 2023-08-20 DIAGNOSIS — J90 Pleural effusion, not elsewhere classified: Secondary | ICD-10-CM | POA: Diagnosis not present

## 2023-08-20 DIAGNOSIS — J869 Pyothorax without fistula: Secondary | ICD-10-CM | POA: Diagnosis not present

## 2023-08-20 DIAGNOSIS — J189 Pneumonia, unspecified organism: Secondary | ICD-10-CM | POA: Diagnosis not present

## 2023-08-20 DIAGNOSIS — J449 Chronic obstructive pulmonary disease, unspecified: Secondary | ICD-10-CM | POA: Diagnosis not present

## 2023-08-20 LAB — BASIC METABOLIC PANEL WITH GFR
Anion gap: 10 (ref 5–15)
BUN: 15 mg/dL (ref 8–23)
CO2: 23 mmol/L (ref 22–32)
Calcium: 8.8 mg/dL — ABNORMAL LOW (ref 8.9–10.3)
Chloride: 98 mmol/L (ref 98–111)
Creatinine, Ser: 0.65 mg/dL (ref 0.61–1.24)
GFR, Estimated: >60 mL/min (ref >60)
Glucose, Bld: 96 mg/dL (ref 70–99)
Potassium: 3.9 mmol/L (ref 3.5–5.1)
Sodium: 131 mmol/L — ABNORMAL LOW (ref 135–145)

## 2023-08-20 LAB — CBC
HCT: 39.3 % (ref 39.0–52.0)
Hemoglobin: 13 g/dL (ref 13.0–17.0)
MCH: 27.7 pg (ref 26.0–34.0)
MCHC: 33.1 g/dL (ref 30.0–36.0)
MCV: 83.8 fL (ref 80.0–100.0)
Platelets: 447 K/uL — ABNORMAL HIGH (ref 150–400)
RBC: 4.69 MIL/uL (ref 4.22–5.81)
RDW: 15.4 % (ref 11.5–15.5)
WBC: 11 K/uL — ABNORMAL HIGH (ref 4.0–10.5)
nRBC: 0 % (ref 0.0–0.2)

## 2023-08-20 LAB — MAGNESIUM: Magnesium: 2 mg/dL (ref 1.7–2.4)

## 2023-08-20 NOTE — Progress Notes (Signed)
 Nutrition Follow Up  DOCUMENTATION CODES:   Severe malnutrition in context of chronic illness, Underweight  INTERVENTION:   Continue Ensure Plus High Protein po BID, each supplement provides 350 kcal and 20 grams of protein.  Continued adequate intake of DYS 2 diet to meet calorie and protein needs  Monitor progress with SLP therapy and any diet advancements recommended by SLP  Added Magic cup TID with meals, each supplement provides 290 kcal and 9 grams of protein to help pt meet protein needs  NUTRITION DIAGNOSIS:   Severe Malnutrition related to chronic illness (COPD, arthritis) as evidenced by severe fat depletion, severe muscle depletion, energy intake < or equal to 75% for > or equal to 1 month, percent weight loss. Remains applicable  GOAL:   Patient will meet greater than or equal to 90% of their needs Progressing  MONITOR:   PO intake, Supplement acceptance  REASON FOR ASSESSMENT:   Consult Assessment of nutrition requirement/status  ASSESSMENT:   Pt with hx of COPD, HTN, arthritis, and tobacco use (recently quit). Admitted for SOB, diagnosed with community acquired pneumonia.  7/24 admitted 7/25 IR for CT placement, SLP bedside eval recommend NPO with follow up MBS after CT placement 7/26 MBS conducted, SLP recommends DYS 2  Pt continues to work with SLP, currently recommended DYS 2 diet for pharyngoesophageal dysphagia. Pt awake and alert during assessment. Pt reports he is feeling better and feles his breathing has improved over the last few days. Pt reports working with SLP and states he has been doing well with the modified food sent to his room. Pt reports eating all of his meals, diet documentation shows 100% intake over last 8 recorded meals. Encouraged pt to continue to eat his meals and drink Ensure supplements, reports drinking 1-2 per day. Discussed adding magic cups to trays, pt agreeable.   Obtained bed scale wt during assessment, wt read 47.9 kg  (including blankets on bed). Will continue to monitor any changes in wt status.   Medications reviewed and include:  Senna Ceftriaxone  (last dose)  Labs reviewed:  Sodium 131  Diet Order:   Diet Order             DIET DYS 2 Room service appropriate? Yes; Fluid consistency: Thin  Diet effective now                   EDUCATION NEEDS:   Education needs have been addressed  Skin:  Skin Assessment: Reviewed RN Assessment  Last BM:  7/27 type 6  Height:   Ht Readings from Last 1 Encounters:  08/20/23 6' 3 (1.905 m)    Weight:   Wt Readings from Last 1 Encounters:  08/20/23 47.9 kg    Ideal Body Weight:  89.1 kg  BMI:  Body mass index is 13.2 kg/m.  Estimated Nutritional Needs:   Kcal:  1600-1900  Protein:  70-90g  Fluid:  1.6-1.9L   Josette Glance, MS, RDN, LDN Clinical Dietitian I Please reach out via secure chat

## 2023-08-20 NOTE — Progress Notes (Signed)
 NAME:  Fred Wise, MRN:  [id number], DOB:  [birth year], LOS: 4 ADMISSION DATE:  08/16/2023, CONSULTATION DATE: 7/24 REFERRING MD: MD Claudene with [facility] CHIEF COMPLAINT: Shortness of breath  History of Present Illness:  Pt is a 68 yr old male with pmhx significant of COPD, HTN, arthritis, and tobacco abuse (quit smoking 3 months ago) presents by EMS from home to [facility] emergency department for shortness of breath, which has been going on for about over a week per her report. Initial workup in the emergency department patient was given Solu-Medrol  125 mg IV and O2 saturations were low 90s on room air, send not needing oxygen  at this standpoint.  Noted to patient to have tachypnea, tachycardia, and blood pressure mainly normotensive to slightly hypertensive.  Initial lab work revealed sodium of 136, potassium of 4.5, creatinine 0.75, BUN 16, WBC of 11.7, hemoglobin 13.6, BNP 69, troponin high-sensitivity 11, procalcitonin 0.57  Chest x-ray-indicated dense consolidation of the right lung along with right pleural effusion CT angio also completed-negative for PE, but concern for multilobar pneumonia, moderate side pleural effusion and and scattered nodules within the left lung-noted to be complex/extensive recommendations by radiologist for repeat imaging within 4 weeks.   PCCM consulted for possible bronc/Thora in regards to CT scan findings.  Upon assessing patient, history was difficult to obtain from patient due to orientation being off.  Patient could tell us  that he was in the hospital but not but not and not really why that he was in the hospital.  Most history was obtained by brother at bedside along with family friend.  Per patient's brother, patient started having increased phlegm over the last week which was thick and white in consistency.  Also brother endorsed that patient has been coughing more often as well as had significant weight loss since Christmas but unable to tell amount of weight  lost.  Brother believes that patient has quit smoking over the last 2 to 3 months.  Patient's brother and family, could only tell me that patient may have been smoking for at least 1 pack a day for 30 to 40 years.  Brother and family member denies patient having chills, fever, any sick contacts, and or significant bleeding.  Brother also stated that patient did drive to doctor's appointment this past Tuesday and fell in the parking lot.  Not able to determine if this was witnessed or not.  In regards to the mentation/lack of orientation, patient's brother stated that mentation has been getting worse over the last couple months  Pertinent  Medical History   Past Medical History:  Diagnosis Date   Arthritis    knees, uses wheelchair   Hypertension      Significant Hospital Events: Including procedures, antibiotic start and stop dates in addition to other pertinent events   7/24: pccm consult for loculated effusion  7/25:  IR pigtail CT placement >> serosanguinous fluid 7/26 >> About 1 L serosanguineous fluid, lytics  Interim History / Subjective:   Denies CP/ dyspnea Dyspnea improved Drained 500 cc  Objective    Blood pressure 112/78, pulse (!) 103, temperature 98 F (36.7 C), temperature source Oral, resp. rate 16, height 6' 3 (1.905 m), weight 44 kg, SpO2 95%.        Intake/Output Summary (Last 24 hours) at 08/20/2023 0726 Last data filed at 08/19/2023 1700 Gross per 24 hour  Intake 490 ml  Output 850 ml  Net -360 ml   Filed Weights   08/16/23 1647 08/17/23  1152  Weight: 44 kg 44 kg    Examination: General: older male, cachectic, no distress , on room air HENT: ncat, perrla, mmm, room air Lungs: Decreased breath sounds on right, no accessory muscle use, no rhonchi , no air leak Cardiovascular: s1s2 no m/r/g Abdomen: flat, soft  Extremities: cachectic  Neuro: alert, oriented to place and time.  Grossly nonfocal GU: Deferred  Labs show decreased leukocytosis, mild  hypokalemia, low procalcitonin Chest x-ray shows RT hydropneumohtorax persists  Resolved problem list   Assessment and Plan  Multilobar pneumonia/CAP Concern for malignancy Weight loss - unknown over months  Right parapneumonic effusion status post pigtail >> right pneumothorax?  Indicating trapped lung COPD CTA chest negative for PE. Does show RLL pneumonia, loculated R effusion. He has emphysema on imaging.  WBC 11.7, procal 0.57. HIV negative. urine negative for strep/Legionella COVID/flu/RSV negative.   Pleural fluid exudative by LDH, low protein, lymphocytic - azithromycin  and rocephin  for CAP coverage  - f/u  pleural studies including cytology , repeat cytology  -No more lytics for now - con't mucinex , duonebs,  - flutter valve, IS  - daily CXR , if pleural fluid non diagnostic, may need bronch  right pneumothorax?  Indicating trapped lung -Keep pigtail to suction  Other Hospital problems managed by primary Altered mental status -resolved, CT head  negative.  Hypertension Arthritis Tobacco abuse   Best Practice (right click and Reselect all SmartList Selections daily)   Per Primary   Labs   CBC: Recent Labs  Lab 08/16/23 0538 08/17/23 0551 08/18/23 1914 08/19/23 0706  WBC 11.7* 10.2 12.2* 11.1*  NEUTROABS 9.0*  --   --   --   HGB 13.6 13.1 12.7* 13.6  HCT 41.7 39.9 38.6* 41.0  MCV 86.3 86.2 84.5 84.5  PLT 456* 451* 418* 409*    Basic Metabolic Panel: Recent Labs  Lab 08/16/23 0538 08/17/23 0551 08/19/23 0706  NA 136 136 134*  K 4.5 3.5 4.5  CL 99 101 99  CO2 23 23 24   GLUCOSE 84 118* 91  BUN 16 24* 15  CREATININE 0.75 0.80 0.57*  CALCIUM 8.9 8.5* 8.7*  MG  --   --  2.1  PHOS  --   --  2.7   GFR: Estimated Creatinine Clearance: 55 mL/min (A) (by C-G formula based on SCr of 0.57 mg/dL (L)). Recent Labs  Lab 08/16/23 0538 08/16/23 1246 08/17/23 0551 08/18/23 1914 08/19/23 0706  PROCALCITON  --  0.57  --   --   --   WBC 11.7*  --   10.2 12.2* 11.1*    Liver Function Tests: Recent Labs  Lab 08/16/23 0538  AST 22  ALT 14  ALKPHOS 58  BILITOT 1.3*  PROT 6.0*  ALBUMIN 2.1*   No results for input(s): LIPASE, AMYLASE in the last 168 hours. No results for input(s): AMMONIA in the last 168 hours.  ABG No results found for: PHART, PCO2ART, PO2ART, HCO3, TCO2, ACIDBASEDEF, O2SAT   Coagulation Profile: Recent Labs  Lab 08/17/23 0923  INR 1.1    Cardiac Enzymes: No results for input(s): CKTOTAL, CKMB, CKMBINDEX, TROPONINI in the last 168 hours.  HbA1C: No results found for: HGBA1C  CBG: No results for input(s): GLUCAP in the last 168 hours.   Harden ROCKFORD Jude, MD [facility] 08/20/23 7:26 AM  Please see [email or web address] for pager details.  From 7A-7P if no response, please call [phone] After hours, please call ELink [phone]

## 2023-08-20 NOTE — Progress Notes (Signed)
 NAME:  Fred Wise, MRN:  [id number], DOB:  1955-07-28, LOS: 4 ADMISSION DATE:  08/16/2023, CONSULTATION DATE: 7/24 REFERRING MD: MD Claudene with [facility] CHIEF COMPLAINT: Shortness of breath  History of Present Illness:  Pt is a 68 yr old male with pmhx significant of COPD, HTN, arthritis, and tobacco abuse (quit smoking 3 months ago) presents by EMS from home to [facility] emergency department for shortness of breath, which has been going on for about over a week per her report. Initial workup in the emergency department patient was given Solu-Medrol  125 mg IV and O2 saturations were low 90s on room air, send not needing oxygen  at this standpoint.  Noted to patient to have tachypnea, tachycardia, and blood pressure mainly normotensive to slightly hypertensive.  Initial lab work revealed sodium of 136, potassium of 4.5, creatinine 0.75, BUN 16, WBC of 11.7, hemoglobin 13.6, BNP 69, troponin high-sensitivity 11, procalcitonin 0.57  Chest x-ray-indicated dense consolidation of the right lung along with right pleural effusion CT angio also completed-negative for PE, but concern for multilobar pneumonia, moderate side pleural effusion and and scattered nodules within the left lung-noted to be complex/extensive recommendations by radiologist for repeat imaging within 4 weeks.   PCCM consulted for possible bronc/Thora in regards to CT scan findings.  Upon assessing patient, history was difficult to obtain from patient due to orientation being off.  Patient could tell us  that he was in the hospital but not but not and not really why that he was in the hospital.  Most history was obtained by brother at bedside along with family friend.  Per patient's brother, patient started having increased phlegm over the last week which was thick and white in consistency.  Also brother endorsed that patient has been coughing more often as well as had significant weight loss since Christmas but unable to tell amount of weight  lost.  Brother believes that patient has quit smoking over the last 2 to 3 months.  Patient's brother and family, could only tell me that patient may have been smoking for at least 1 pack a day for 30 to 40 years.  Brother and family member denies patient having chills, fever, any sick contacts, and or significant bleeding.  Brother also stated that patient did drive to doctor's appointment this past Tuesday and fell in the parking lot.  Not able to determine if this was witnessed or not.  In regards to the mentation/lack of orientation, patient's brother stated that mentation has been getting worse over the last couple months  Pertinent  Medical History   Past Medical History:  Diagnosis Date   Arthritis    knees, uses wheelchair   Hypertension      Significant Hospital Events: Including procedures, antibiotic start and stop dates in addition to other pertinent events   7/24: pccm consult for loculated effusion  7/25:  IR pigtail CT placement >> serosanguinous fluid 7/26 >> About 1 L serosanguineous fluid, lytics  Interim History / Subjective:   Denies CP/ dyspnea Minimal drainage from chest tube Afebrile Coughing up white-yellow sputum  Objective    Blood pressure 102/63, pulse (!) 103, temperature 97.9 F (36.6 C), resp. rate 18, height 6' 3 (1.905 m), weight 44 kg, SpO2 94%.        Intake/Output Summary (Last 24 hours) at 08/20/2023 0958 Last data filed at 08/20/2023 0749 Gross per 24 hour  Intake 850 ml  Output 850 ml  Net 0 ml   Filed Weights   08/16/23  1647 08/17/23 1152  Weight: 44 kg 44 kg    Examination: General: older male, cachectic, no distress , on room air HENT: ncat, perrla, mmm, room air Lungs: Decreased breath sounds on right, no accessory muscle use, no rhonchi , no air leak on chest tube Cardiovascular: s1s2 no m/r/g Abdomen: flat, soft  Extremities: cachectic  Neuro: alert, oriented to place and time.  Grossly nonfocal   Pigtail was flushed  bilateral and 10 cc withdrawn sent for repeat cytology Labs show decreased leukocytosis, hyponatremia Chest x-ray shows RT pneumothorax persists  Resolved problem list   Assessment and Plan  Multilobar pneumonia/CAP Concern for malignancy Weight loss - unknown over months  Right parapneumonic effusion status post pigtail >> right pneumothorax?  Indicating trapped lung COPD CTA chest negative for PE. Does show RLL pneumonia, loculated R effusion. He has emphysema on imaging.  WBC 11.7, procal 0.57. HIV negative. urine negative for strep/Legionella COVID/flu/RSV negative.   Pleural fluid exudative by LDH, low protein, lymphocytic - azithromycin  and rocephin  for CAP coverage  - f/u  pleural studies including cytology , repeat cytology x2 sent -No more lytics for now - con't mucinex , duonebs,  - flutter valve, IS  - daily CXR , if pleural fluid non diagnostic, may need bronch, but pneumothorax has to resolved first  right pneumothorax?  Indicating trapped lung --I flushed pigtail and was able to obtain respiratory variation in Pleur-evac, no airleak -Keep pigtail to suction , increase to 30 cm  Other Hospital problems managed by primary Altered mental status -resolved, CT head  negative.  Hypertension Arthritis Tobacco abuse   Best Practice (right click and Reselect all SmartList Selections daily)   Per Primary   Labs   CBC: Recent Labs  Lab 08/16/23 0538 08/17/23 0551 08/18/23 1914 08/19/23 0706 08/20/23 0718  WBC 11.7* 10.2 12.2* 11.1* 11.0*  NEUTROABS 9.0*  --   --   --   --   HGB 13.6 13.1 12.7* 13.6 13.0  HCT 41.7 39.9 38.6* 41.0 39.3  MCV 86.3 86.2 84.5 84.5 83.8  PLT 456* 451* 418* 409* 447*    Basic Metabolic Panel: Recent Labs  Lab 08/16/23 0538 08/17/23 0551 08/19/23 0706 08/20/23 0718  NA 136 136 134* 131*  K 4.5 3.5 4.5 3.9  CL 99 101 99 98  CO2 23 23 24 23   GLUCOSE 84 118* 91 96  BUN 16 24* 15 15  CREATININE 0.75 0.80 0.57* 0.65  CALCIUM  8.9 8.5* 8.7* 8.8*  MG  --   --  2.1 2.0  PHOS  --   --  2.7  --    GFR: Estimated Creatinine Clearance: 55 mL/min (by C-G formula based on SCr of 0.65 mg/dL). Recent Labs  Lab 08/16/23 1246 08/17/23 0551 08/18/23 1914 08/19/23 0706 08/20/23 0718  PROCALCITON 0.57  --   --   --   --   WBC  --  10.2 12.2* 11.1* 11.0*    Liver Function Tests: Recent Labs  Lab 08/16/23 0538  AST 22  ALT 14  ALKPHOS 58  BILITOT 1.3*  PROT 6.0*  ALBUMIN 2.1*   No results for input(s): LIPASE, AMYLASE in the last 168 hours. No results for input(s): AMMONIA in the last 168 hours.  ABG No results found for: PHART, PCO2ART, PO2ART, HCO3, TCO2, ACIDBASEDEF, O2SAT   Coagulation Profile: Recent Labs  Lab 08/17/23 0923  INR 1.1    Cardiac Enzymes: No results for input(s): CKTOTAL, CKMB, CKMBINDEX, TROPONINI in the last 168  hours.  HbA1C: No results found for: HGBA1C  CBG: No results for input(s): GLUCAP in the last 168 hours.   Harden ROCKFORD Jude, MD [facility] 08/20/23 9:58 AM  Please see [email or web address] for pager details.  From 7A-7P if no response, please call [phone] After hours, please call ELink [phone]

## 2023-08-20 NOTE — Progress Notes (Signed)
 Speech Language Pathology Treatment: Dysphagia  Patient Details Name: Fred Wise MRN: [id number] DOB: [birth year] Today's Date: 08/20/2023 Time: 8967-8957 SLP Time Calculation (min) (ACUTE ONLY): 10 min  Assessment / Plan / Recommendation Clinical Impression  Pt seen for ongoing dysphagia management.  RN reports good PO intake with breakfast this morning.  Pt reports no difficulty.  Today he exhibited delayed cough x1 with thin liquid.  It is unclear if this was related to penetration/possible aspiration, versus a reflux related cough.  There was significant esophageal retention during MBS on 7/26.  There was frequent belching during PO trials.  Pt with multiple swallows following PO trials, which is beneficial for clearing pharyngeal residue.  Pt exhibited good oral clearance of puree, simulated ground, and soft solid textures.  Discussed diet preference with pt who is content to remain on ground/chopped diet rather than advancing to soft solids.    Recommend continuing chopped/ground (dysphagia 2) diet with thin liquid by cup sips with aspiration precautions as noted below.    HPI HPI: Pt is a 68 yr old male who presented by EMS from home to [facility] emergency department for shortness of breath, which has been going on for about over a week per her report. CT Chest 7/24: 2. Consolidation of the entire right lower lobe and right middle  lobe, as well as much of the posterior portion of the right upper  lobe, with associated air bronchograms. Consolidation and scattered  nodularity along with varicoid bronchiectasis in the left lower  lobe. Some of the areas of nodularity are probably areas of cystic  bronchiectasis. Appearance compatible with multilobar pneumonia.  Strictly speaking a right lung mass particularly in the right lower  lobe cannot be excluded given the degree airspace opacity and airway  truncation.  3. Scattered nodules in the left lung. Given the complex and  extensive nature  findings in this case, I recommend followup PA and  lateral chest radiography in 4 weeks time after appropriate  antibiotic therapy to reassess the lungs, and consideration of  noncontrast chest CT or PET-CT in 1-3 months to reassess the  underlying nodularity. Pt with pmhx significant of COPD, HTN, arthritis, and tobacco abuse (quit smoking 3 months ago).      SLP Plan  Continue with current plan of care          Recommendations  Diet recommendations: Dysphagia 2 (fine chop);Thin liquid Liquids provided via: Cup;No straw Medication Administration: Whole meds with puree Supervision: Patient able to self feed Compensations:  Minimize environmental distractions; Slow rate; Small sips/bites; Follow solids with liquid; Multiple dry swallows after each bite/sip; Intermittent supervision for cuing Postural Changes and/or Swallow Maneuvers:  Seated upright 90 degrees; Upright 30-60 min after meal                  Oral care BID   None Dysphagia, pharyngoesophageal phase (R13.14)     Continue with current plan of care     Anette FORBES Grippe, MA, CCC-SLP Acute Rehabilitation Services Office: [phone] 08/20/2023, 10:50 AM

## 2023-08-20 NOTE — Progress Notes (Signed)
 [facility] Progress Note Patient: Guilford Shannahan FMW:[id number] DOB: 1955-01-31 DOA: 08/16/2023  DOS: the patient was seen and examined on 08/20/2023  Brief Hospital Course: Patient with PMH of HTN, smoker, failure to thrive, presented to the hospital with complaints of cough and shortness of breath. Found to have right lower lobe pneumonia as well as possible parapneumonic effusion. Pulmonary consulted. Underwent IR guided chest tube placement on 7/25.  Assessment and Plan: Right lower lobe pneumonia. Right pleural effusion with loculation. Possible lung mass. Post chest tube insertion pneumothorax Appreciate pulmonary consultation. Suspect most likely secondary to pneumonia versus.  Malignancy Receiving IV antibiotics. Underwent IR guided chest tube insertion on 7/25. LDH elevated suggesting exudative fluid. Culture and cytology currently pending. Chest x-ray shows unchanged hydropneumothorax. Concerning for trapped lung. Pleural lytic therapy done by pulmonary on 7/26.  Dysphagia. SLP evaluation. MBS performed. Dysphagia 2 diet recommended.  Acute metabolic encephalopathy. Likely in the setting of poor p.o. intake as well as infection. Mentation improving.  No focal deficit.  Weight loss. Adult failure to thrive. Underweight. Severe protein calorie malnutrition. Body mass index is 12.12 kg/m.  Dietitian following. Will monitor. Placing the patient at high risk for poor outcome.  Former smoker. Quit 3 months ago. Monitor for now.   Subjective: No nausea no vomiting no fever no chills with no chest pain.  Breathing okay.  Physical Exam: Bilateral basal crackles. S1-S2 present Bowel sound present.  Cachectic.  Data Reviewed: I have Reviewed nursing notes, Vitals, and Lab results. Since last encounter, pertinent lab results CBC and BMP   . I have ordered test including CBC and BMP  . I have discussed pt's care plan and test results with pulmonary  .    Disposition: Status is: Inpatient Remains inpatient appropriate because: Monitor for cytology and further workup  enoxaparin  (LOVENOX ) injection 40 mg Start: 08/16/23 1100   Family Communication: No one at bedside discussed with brother on the phone. Level of care: Telemetry Medical continue Vitals:   08/20/23 0521 08/20/23 0814 08/20/23 1337 08/20/23 1533  BP: 112/78 102/63  103/66  Pulse: (!) 103   (!) 110  Resp: 16 18  16   Temp: 98 F (36.7 C) 97.9 F (36.6 C)  97.9 F (36.6 C)  TempSrc: Oral   Oral  SpO2: 95% 94%  94%  Weight:   47.9 kg   Height:   6' 3 (1.905 m)      Author: Yetta Blanch, MD 08/20/2023 5:36 PM  Please look on [email or web address] to find out who is on call.

## 2023-08-20 NOTE — Plan of Care (Signed)

## 2023-08-21 ENCOUNTER — Inpatient Hospital Stay (HOSPITAL_COMMUNITY)

## 2023-08-21 DIAGNOSIS — J189 Pneumonia, unspecified organism: Secondary | ICD-10-CM | POA: Diagnosis not present

## 2023-08-21 DIAGNOSIS — J90 Pleural effusion, not elsewhere classified: Secondary | ICD-10-CM | POA: Diagnosis not present

## 2023-08-21 DIAGNOSIS — J869 Pyothorax without fistula: Secondary | ICD-10-CM | POA: Diagnosis not present

## 2023-08-21 DIAGNOSIS — J449 Chronic obstructive pulmonary disease, unspecified: Secondary | ICD-10-CM | POA: Diagnosis not present

## 2023-08-21 LAB — CBC
HCT: 35.8 % — ABNORMAL LOW (ref 39.0–52.0)
Hemoglobin: 12.3 g/dL — ABNORMAL LOW (ref 13.0–17.0)
MCH: 28.9 pg (ref 26.0–34.0)
MCHC: 34.4 g/dL (ref 30.0–36.0)
MCV: 84 fL (ref 80.0–100.0)
Platelets: 421 K/uL — ABNORMAL HIGH (ref 150–400)
RBC: 4.26 MIL/uL (ref 4.22–5.81)
RDW: 15.6 % — ABNORMAL HIGH (ref 11.5–15.5)
WBC: 10.5 K/uL (ref 4.0–10.5)
nRBC: 0 % (ref 0.0–0.2)

## 2023-08-21 LAB — BASIC METABOLIC PANEL WITH GFR
Anion gap: 9 (ref 5–15)
BUN: 18 mg/dL (ref 8–23)
CO2: 25 mmol/L (ref 22–32)
Calcium: 8.7 mg/dL — ABNORMAL LOW (ref 8.9–10.3)
Chloride: 97 mmol/L — ABNORMAL LOW (ref 98–111)
Creatinine, Ser: 0.54 mg/dL — ABNORMAL LOW (ref 0.61–1.24)
GFR, Estimated: >60 mL/min (ref >60)
Glucose, Bld: 92 mg/dL (ref 70–99)
Potassium: 3.9 mmol/L (ref 3.5–5.1)
Sodium: 131 mmol/L — ABNORMAL LOW (ref 135–145)

## 2023-08-21 LAB — MAGNESIUM: Magnesium: 2 mg/dL (ref 1.7–2.4)

## 2023-08-21 LAB — BODY FLUID CULTURE W GRAM STAIN
Culture: NO GROWTH
Gram Stain: NONE SEEN

## 2023-08-21 LAB — CYTOLOGY - NON PAP

## 2023-08-21 MED ORDER — SODIUM CHLORIDE 0.9 % IV BOLUS
1000.0000 mL | Freq: Once | INTRAVENOUS | Status: AC
  Administered 2023-08-21: 1000 mL via INTRAVENOUS

## 2023-08-21 NOTE — Plan of Care (Signed)
   Problem: Coping: Goal: Level of anxiety will decrease Outcome: Progressing

## 2023-08-21 NOTE — Progress Notes (Signed)
 Bronchoscopy under fluoroscopy scheduled for 11:30 AM tomorrow with endoscopy N.p.o. after midnight  Lanita Stammen V. Jude MD

## 2023-08-21 NOTE — Progress Notes (Incomplete)
 NAME:  Fred Wise, MRN:  [id number], DOB:  1955/04/03, LOS: 5 ADMISSION DATE:  08/16/2023, CONSULTATION DATE: 7/24 REFERRING MD: MD Claudene with [facility] CHIEF COMPLAINT: Shortness of breath  History of Present Illness:  Pt is a 68 yr old male with pmhx significant of COPD, HTN, arthritis, and tobacco abuse (quit smoking 3 months ago) presents by EMS from home to [facility] emergency department for shortness of breath, which has been going on for about over a week per her report. Initial workup in the emergency department patient was given Solu-Medrol  125 mg IV and O2 saturations were low 90s on room air, send not needing oxygen  at this standpoint.  Noted to patient to have tachypnea, tachycardia, and blood pressure mainly normotensive to slightly hypertensive.  Initial lab work revealed sodium of 136, potassium of 4.5, creatinine 0.75, BUN 16, WBC of 11.7, hemoglobin 13.6, BNP 69, troponin high-sensitivity 11, procalcitonin 0.57  Chest x-ray-indicated dense consolidation of the right lung along with right pleural effusion CT angio also completed-negative for PE, but concern for multilobar pneumonia, moderate side pleural effusion and and scattered nodules within the left lung-noted to be complex/extensive recommendations by radiologist for repeat imaging within 4 weeks.   PCCM consulted for possible bronc/Thora in regards to CT scan findings.  Upon assessing patient, history was difficult to obtain from patient due to orientation being off.  Patient could tell us  that he was in the hospital but not but not and not really why that he was in the hospital.  Most history was obtained by brother at bedside along with family friend.  Per patient's brother, patient started having increased phlegm over the last week which was thick and white in consistency.  Also brother endorsed that patient has been coughing more often as well as had significant weight loss since Christmas but unable to tell amount of weight  lost.  Brother believes that patient has quit smoking over the last 2 to 3 months.  Patient's brother and family, could only tell me that patient may have been smoking for at least 1 pack a day for 30 to 40 years.  Brother and family member denies patient having chills, fever, any sick contacts, and or significant bleeding.  Brother also stated that patient did drive to doctor's appointment this past Tuesday and fell in the parking lot.  Not able to determine if this was witnessed or not.  In regards to the mentation/lack of orientation, patient's brother stated that mentation has been getting worse over the last couple months  Pertinent  Medical History   Past Medical History:  Diagnosis Date   Arthritis    knees, uses wheelchair   Hypertension      Significant Hospital Events: Including procedures, antibiotic start and stop dates in addition to other pertinent events   7/24: pccm consult for loculated effusion  7/25:  IR pigtail CT placement >> serosanguinous fluid 7/26 >> About 1 L serosanguineous fluid, lytics  Interim History / Subjective:   Denies CP/ dyspnea Minimal drainage from chest tube Afebrile Coughing up white-yellow sputum  Objective    Blood pressure (!) 95/59, pulse (!) 104, temperature 98.4 F (36.9 C), temperature source Oral, resp. rate 17, height 6' 3 (1.905 m), weight 47.9 kg, SpO2 93%.        Intake/Output Summary (Last 24 hours) at 08/21/2023 0955 Last data filed at 08/21/2023 0538 Gross per 24 hour  Intake 610 ml  Output 1300 ml  Net -690 ml   Filed  Weights   08/16/23 1647 08/17/23 1152 08/20/23 1337  Weight: 44 kg 44 kg 47.9 kg    Examination: General: older male, cachectic, no distress , on room air HENT: ncat, perrla, mmm, room air Lungs: Decreased breath sounds on right, no accessory muscle use, no rhonchi , no air leak on chest tube Cardiovascular: s1s2 no m/r/g Abdomen: flat, soft  Extremities: cachectic  Neuro: alert, oriented to place  and time.  Grossly nonfocal   Pigtail was flushed bilateral and 10 cc withdrawn sent for repeat cytology Labs show decreased leukocytosis, hyponatremia Chest x-ray shows RT pneumothorax persists  Resolved problem list   Assessment and Plan  Multilobar pneumonia/CAP Concern for malignancy Weight loss - unknown over months  Right parapneumonic effusion status post pigtail >> right pneumothorax?  Indicating trapped lung COPD CTA chest negative for PE. Does show RLL pneumonia, loculated R effusion. He has emphysema on imaging.  WBC 11.7, procal 0.57. HIV negative. urine negative for strep/Legionella COVID/flu/RSV negative.   Pleural fluid exudative by LDH, low protein, lymphocytic - azithromycin  and rocephin  for CAP coverage  - f/u  pleural studies including cytology , repeat cytology x2 sent -No more lytics for now - con't mucinex , duonebs,  - flutter valve, IS  - daily CXR , if pleural fluid non diagnostic, may need bronch, but pneumothorax has to resolved first  right pneumothorax?  Indicating trapped lung --I flushed pigtail and was able to obtain respiratory variation in Pleur-evac, no airleak -Keep pigtail to suction , increase to 30 cm  Other Hospital problems managed by primary Altered mental status -resolved, CT head  negative.  Hypertension Arthritis Tobacco abuse   Best Practice (right click and Reselect all SmartList Selections daily)   Per Primary   Labs   CBC: Recent Labs  Lab 08/16/23 0538 08/17/23 0551 08/18/23 1914 08/19/23 0706 08/20/23 0718 08/21/23 0607  WBC 11.7* 10.2 12.2* 11.1* 11.0* 10.5  NEUTROABS 9.0*  --   --   --   --   --   HGB 13.6 13.1 12.7* 13.6 13.0 12.3*  HCT 41.7 39.9 38.6* 41.0 39.3 35.8*  MCV 86.3 86.2 84.5 84.5 83.8 84.0  PLT 456* 451* 418* 409* 447* 421*    Basic Metabolic Panel: Recent Labs  Lab 08/16/23 0538 08/17/23 0551 08/19/23 0706 08/20/23 0718 08/21/23 0607  NA 136 136 134* 131* 131*  K 4.5 3.5 4.5 3.9  3.9  CL 99 101 99 98 97*  CO2 23 23 24 23 25   GLUCOSE 84 118* 91 96 92  BUN 16 24* 15 15 18   CREATININE 0.75 0.80 0.57* 0.65 0.54*  CALCIUM 8.9 8.5* 8.7* 8.8* 8.7*  MG  --   --  2.1 2.0 2.0  PHOS  --   --  2.7  --   --    GFR: Estimated Creatinine Clearance: 59.9 mL/min (A) (by C-G formula based on SCr of 0.54 mg/dL (L)). Recent Labs  Lab 08/16/23 1246 08/17/23 0551 08/18/23 1914 08/19/23 0706 08/20/23 0718 08/21/23 0607  PROCALCITON 0.57  --   --   --   --   --   WBC  --    < > 12.2* 11.1* 11.0* 10.5   < > = values in this interval not displayed.    Liver Function Tests: Recent Labs  Lab 08/16/23 0538  AST 22  ALT 14  ALKPHOS 58  BILITOT 1.3*  PROT 6.0*  ALBUMIN 2.1*   No results for input(s): LIPASE, AMYLASE in the last 168  hours. No results for input(s): AMMONIA in the last 168 hours.  ABG No results found for: PHART, PCO2ART, PO2ART, HCO3, TCO2, ACIDBASEDEF, O2SAT   Coagulation Profile: Recent Labs  Lab 08/17/23 0923  INR 1.1    Cardiac Enzymes: No results for input(s): CKTOTAL, CKMB, CKMBINDEX, TROPONINI in the last 168 hours.  HbA1C: No results found for: HGBA1C  CBG: No results for input(s): GLUCAP in the last 168 hours.   Leita SAUNDERS Kylan Veach, PA-C [facility] 08/21/23 9:55 AM  Please see [email or web address] for pager details.  From 7A-7P if no response, please call [phone] After hours, please call ELink [phone]

## 2023-08-21 NOTE — H&P (View-Only) (Signed)
 Bronchoscopy under fluoroscopy scheduled for 11:30 AM tomorrow with endoscopy N.p.o. after midnight  Lanita Stammen V. Jude MD

## 2023-08-21 NOTE — Progress Notes (Signed)
   08/21/23 0725  Assess: MEWS Score  Temp 98.4 F (36.9 C)  BP (!) 95/59  MAP (mmHg) 69  Pulse Rate (!) 109  SpO2 93 %  Assess: MEWS Score  MEWS Temp 0  MEWS Systolic 1  MEWS Pulse 1  MEWS RR 0  MEWS LOC 0  MEWS Score 2  MEWS Score Color Yellow  Assess: if the MEWS score is Yellow or Red  Were vital signs accurate and taken at a resting state? Yes  Does the patient meet 2 or more of the SIRS criteria? No  MEWS guidelines implemented  Yes, yellow  Treat  MEWS Interventions Considered administering scheduled or prn medications/treatments as ordered  Take Vital Signs  Increase Vital Sign Frequency  Yellow: Q2hr x1, continue Q4hrs until patient remains green for 12hrs  Escalate  MEWS: Escalate Yellow: Discuss with charge nurse and consider notifying provider and/or RRT  Provider Notification  Provider Name/Title Yetta Blanch  Date Provider Notified 08/21/23  Time Provider Notified 0740  Method of Notification Page  Notification Reason Other (Comment)  Provider response See new orders  Date of Provider Response 08/21/23  Time of Provider Response 0745  Assess: SIRS CRITERIA  SIRS Temperature  0  SIRS Respirations  0  SIRS Pulse 1  SIRS WBC 0  SIRS Score Sum  1   Pt seen in room alert/oriented in no apparent distress, denies any pain/discomfort at this time. Attending MD paged, and received order for  bolus.

## 2023-08-21 NOTE — Progress Notes (Signed)
 NAME:  Fred Wise, MRN:  [id number], DOB:  08/30/1955, LOS: 5 ADMISSION DATE:  08/16/2023, CONSULTATION DATE: 7/24 REFERRING MD: MD Claudene with [facility] CHIEF COMPLAINT: Shortness of breath  History of Present Illness:  Pt is a 68 yr old male with pmhx significant of COPD, HTN, arthritis, and tobacco abuse (quit smoking 3 months ago) presents by EMS from home to [facility] emergency department for shortness of breath, which has been going on for about over a week per her report. Initial workup in the emergency department patient was given Solu-Medrol  125 mg IV and O2 saturations were low 90s on room air, send not needing oxygen  at this standpoint.  Noted to patient to have tachypnea, tachycardia, and blood pressure mainly normotensive to slightly hypertensive.  Initial lab work revealed sodium of 136, potassium of 4.5, creatinine 0.75, BUN 16, WBC of 11.7, hemoglobin 13.6, BNP 69, troponin high-sensitivity 11, procalcitonin 0.57  Chest x-ray-indicated dense consolidation of the right lung along with right pleural effusion CT angio also completed-negative for PE, but concern for multilobar pneumonia, moderate side pleural effusion and and scattered nodules within the left lung-noted to be complex/extensive recommendations by radiologist for repeat imaging within 4 weeks.   PCCM consulted for possible bronc/Thora in regards to CT scan findings.  Upon assessing patient, history was difficult to obtain from patient due to orientation being off.  Patient could tell us  that he was in the hospital but not but not and not really why that he was in the hospital.  Most history was obtained by brother at bedside along with family friend.  Per patient's brother, patient started having increased phlegm over the last week which was thick and white in consistency.  Also brother endorsed that patient has been coughing more often as well as had significant weight loss since Christmas but unable to tell amount of weight  lost.  Brother believes that patient has quit smoking over the last 2 to 3 months.  Patient's brother and family, could only tell me that patient may have been smoking for at least 1 pack a day for 30 to 40 years.  Brother and family member denies patient having chills, fever, any sick contacts, and or significant bleeding.  Brother also stated that patient did drive to doctor's appointment this past Tuesday and fell in the parking lot.  Not able to determine if this was witnessed or not.  In regards to the mentation/lack of orientation, patient's brother stated that mentation has been getting worse over the last couple months  Pertinent  Medical History   Past Medical History:  Diagnosis Date   Arthritis    knees, uses wheelchair   Hypertension      Significant Hospital Events: Including procedures, antibiotic start and stop dates in addition to other pertinent events   7/24: pccm consult for loculated effusion  7/25:  IR pigtail CT placement >> serosanguinous fluid 7/26 >> About 1 L serosanguineous fluid, lytics  Interim History / Subjective:   Denies CP/ dyspnea 450 cc drainage from chest tube, light yellow serous fluid Afebrile Coughing is decreased  Objective    Blood pressure (!) 95/59, pulse (!) 104, temperature 98.4 F (36.9 C), temperature source Oral, resp. rate 17, height 6' 3 (1.905 m), weight 47.9 kg, SpO2 93%.        Intake/Output Summary (Last 24 hours) at 08/21/2023 1238 Last data filed at 08/21/2023 0538 Gross per 24 hour  Intake 610 ml  Output 1300 ml  Net -690  ml   Filed Weights   08/16/23 1647 08/17/23 1152 08/20/23 1337  Weight: 44 kg 44 kg 47.9 kg    Examination: General: older male, cachectic, no distress , on room air HENT: ncat, perrla, mmm Lungs: No accessory muscle use, decreased breath sounds on right, no air leak on chest tube Cardiovascular: s1s2 no m/r/g Abdomen: flat, soft  Extremities: cachectic  Neuro: alert, oriented to place and  time.  Grossly nonfocal    Labs show decreased leukocytosis, hyponatremia Chest x-ray shows decreased right pneumothorax, fluid has collected again  Resolved problem list   Assessment and Plan  Multilobar pneumonia/CAP Concern for malignancy Weight loss - unknown over months  Right parapneumonic effusion status post pigtail >> right pneumothorax?  Indicating trapped lung COPD CTA chest negative for PE. Does show RLL pneumonia, loculated R effusion. He has emphysema on imaging.  WBC 11.7, procal 0.57. HIV negative. urine negative for strep/Legionella COVID/flu/RSV negative.   -Now that pneumothorax has mostly resolved, proceed with bronchoscopy) for transbronchial biopsy under fluoroscopy, I discussed risks and benefits with patient in detail  Pleural fluid exudative by LDH, low protein, lymphocytic - azithromycin  and rocephin  for CAP coverage  - Pleural fluid cytology negative x 1, I had also sent to other samples but not processed in the lab -No more lytics for now - con't mucinex , duonebs,  - flutter valve, IS    right Hydropneumothorax?  Indicating trapped lung  -Keep pigtail to suction , increase to 30 cm - Flush every shift  Other Hospital problems managed by primary Altered mental status -resolved, CT head  negative.  Hypertension Arthritis Tobacco abuse   Best Practice (right click and Reselect all SmartList Selections daily)   Per Primary   Labs   CBC: Recent Labs  Lab 08/16/23 0538 08/17/23 0551 08/18/23 1914 08/19/23 0706 08/20/23 0718 08/21/23 0607  WBC 11.7* 10.2 12.2* 11.1* 11.0* 10.5  NEUTROABS 9.0*  --   --   --   --   --   HGB 13.6 13.1 12.7* 13.6 13.0 12.3*  HCT 41.7 39.9 38.6* 41.0 39.3 35.8*  MCV 86.3 86.2 84.5 84.5 83.8 84.0  PLT 456* 451* 418* 409* 447* 421*    Basic Metabolic Panel: Recent Labs  Lab 08/16/23 0538 08/17/23 0551 08/19/23 0706 08/20/23 0718 08/21/23 0607  NA 136 136 134* 131* 131*  K 4.5 3.5 4.5 3.9 3.9  CL  99 101 99 98 97*  CO2 23 23 24 23 25   GLUCOSE 84 118* 91 96 92  BUN 16 24* 15 15 18   CREATININE 0.75 0.80 0.57* 0.65 0.54*  CALCIUM 8.9 8.5* 8.7* 8.8* 8.7*  MG  --   --  2.1 2.0 2.0  PHOS  --   --  2.7  --   --    GFR: Estimated Creatinine Clearance: 59.9 mL/min (A) (by C-G formula based on SCr of 0.54 mg/dL (L)). Recent Labs  Lab 08/16/23 1246 08/17/23 0551 08/18/23 1914 08/19/23 0706 08/20/23 0718 08/21/23 0607  PROCALCITON 0.57  --   --   --   --   --   WBC  --    < > 12.2* 11.1* 11.0* 10.5   < > = values in this interval not displayed.    Liver Function Tests: Recent Labs  Lab 08/16/23 0538  AST 22  ALT 14  ALKPHOS 58  BILITOT 1.3*  PROT 6.0*  ALBUMIN 2.1*   No results for input(s): LIPASE, AMYLASE in the last 168 hours. No  results for input(s): AMMONIA in the last 168 hours.  ABG No results found for: PHART, PCO2ART, PO2ART, HCO3, TCO2, ACIDBASEDEF, O2SAT   Coagulation Profile: Recent Labs  Lab 08/17/23 0923  INR 1.1    Cardiac Enzymes: No results for input(s): CKTOTAL, CKMB, CKMBINDEX, TROPONINI in the last 168 hours.  HbA1C: No results found for: HGBA1C  CBG: No results for input(s): GLUCAP in the last 168 hours.   Harden ROCKFORD Jude, MD [facility] 08/21/23 12:38 PM  Please see [email or web address] for pager details.  From 7A-7P if no response, please call [phone] After hours, please call ELink [phone]

## 2023-08-21 NOTE — Progress Notes (Signed)
 [facility] Progress Note Patient: Fred Wise FMW:[id number] DOB: [birth year] DOA: 08/16/2023  DOS: the patient was seen and examined on 08/21/2023  Brief Hospital Course: Patient with PMH of HTN, smoker, failure to thrive, presented to the hospital with complaints of cough and shortness of breath. Found to have right lower lobe pneumonia as well as possible parapneumonic effusion. Pulmonary consulted. Underwent IR guided chest tube placement on 7/25.  Assessment and Plan: Right lower lobe pneumonia. Right pleural effusion with loculation. Possible lung mass. Post chest tube insertion pneumothorax Appreciate pulmonary consultation. Suspect most likely secondary to pneumonia versus.  Malignancy Receiving IV antibiotics. Underwent IR guided chest tube insertion on 7/25. LDH elevated suggesting exudative fluid. Fluid culture negative.  Cytology negative for any malignancy. Chest x-ray shows unchanged hydropneumothorax. Concerning for trapped lung. Pleural lytic therapy done by pulmonary on 7/26. Scheduled for bronchoscopy tomorrow n.p.o. after midnight.  Dysphagia. SLP evaluation. MBS performed. Dysphagia 2 diet recommended.  Acute metabolic encephalopathy. Likely in the setting of poor p.o. intake as well as infection. Mentation improving.  No focal deficit.  Weight loss. Adult failure to thrive. Underweight. Severe protein calorie malnutrition. Body mass index is 12.12 kg/m.  Dietitian following. Will monitor. Placing the patient at high risk for poor outcome.  Former smoker. Quit 3 months ago. Monitor for now.   Subjective: No nausea no vomiting no fever no chills.  Continues to have some cough.  Oral intake adequate.  Continues to report left knee arthritis pain.  Blood pressure was low this morning although asymptomatic.  Physical Exam: Basilar crackles.  Right more than left S1-S2 present junctional also present but No edema.  Data Reviewed: I have  Reviewed nursing notes, Vitals, and Lab results. Since last encounter, pertinent lab results CBC and BMP   . I have ordered test including CBC and BMP  . I have discussed pt's care plan and test results with pulmonary  .   Disposition: Status is: Inpatient Remains inpatient appropriate because: Monitor for further workup for his lung mass/pneumonia  enoxaparin  (LOVENOX ) injection 40 mg Start: 08/16/23 1100   Family Communication: No one at bedside.  Discussed with brother on 7/28. Level of care: Telemetry Medical   Vitals:   08/21/23 0925 08/21/23 1125 08/21/23 1413 08/21/23 1525  BP: 108/69 124/75 120/71 118/68  Pulse: (!) 108 (!) 108 (!) 108 (!) 109  Resp: 16 18    Temp: 98.3 F (36.8 C) 98.2 F (36.8 C) 98.5 F (36.9 C) 98.5 F (36.9 C)  TempSrc: Oral Oral Oral Oral  SpO2: 94% 94% 100% 98%  Weight:      Height:         Author: Yetta Blanch, MD 08/21/2023 5:51 PM  Please look on [email or web address] to find out who is on call.

## 2023-08-21 NOTE — Plan of Care (Signed)
   Problem: Education: Goal: Knowledge of General Education information will improve Description: Including pain rating scale, medication(s)/side effects and non-pharmacologic comfort measures Outcome: Progressing   Problem: Clinical Measurements: Goal: Will remain free from infection Outcome: Progressing   Problem: Activity: Goal: Risk for activity intolerance will decrease Outcome: Progressing

## 2023-08-22 ENCOUNTER — Inpatient Hospital Stay (HOSPITAL_COMMUNITY): Admitting: Anesthesiology

## 2023-08-22 ENCOUNTER — Inpatient Hospital Stay (HOSPITAL_COMMUNITY)

## 2023-08-22 ENCOUNTER — Encounter (HOSPITAL_COMMUNITY): Payer: Self-pay | Admitting: Internal Medicine

## 2023-08-22 ENCOUNTER — Encounter (HOSPITAL_COMMUNITY)
Admission: EM | Disposition: A | Discharge status: Skilled Nursing Facility | Payer: Self-pay | Source: Home / Self Care | Attending: Internal Medicine

## 2023-08-22 DIAGNOSIS — J189 Pneumonia, unspecified organism: Secondary | ICD-10-CM | POA: Diagnosis not present

## 2023-08-22 DIAGNOSIS — J90 Pleural effusion, not elsewhere classified: Secondary | ICD-10-CM | POA: Diagnosis not present

## 2023-08-22 DIAGNOSIS — I10 Essential (primary) hypertension: Secondary | ICD-10-CM

## 2023-08-22 DIAGNOSIS — F17210 Nicotine dependence, cigarettes, uncomplicated: Secondary | ICD-10-CM

## 2023-08-22 DIAGNOSIS — G9340 Encephalopathy, unspecified: Secondary | ICD-10-CM | POA: Diagnosis not present

## 2023-08-22 HISTORY — PX: VIDEO BRONCHOSCOPY: SHX5072

## 2023-08-22 LAB — BASIC METABOLIC PANEL WITH GFR
Anion gap: 8 (ref 5–15)
BUN: 11 mg/dL (ref 8–23)
CO2: 25 mmol/L (ref 22–32)
Calcium: 8.9 mg/dL (ref 8.9–10.3)
Chloride: 99 mmol/L (ref 98–111)
Creatinine, Ser: 0.51 mg/dL — ABNORMAL LOW (ref 0.61–1.24)
GFR, Estimated: >60 mL/min (ref >60)
Glucose, Bld: 98 mg/dL (ref 70–99)
Potassium: 4.3 mmol/L (ref 3.5–5.1)
Sodium: 132 mmol/L — ABNORMAL LOW (ref 135–145)

## 2023-08-22 LAB — CBC
HCT: 38.3 % — ABNORMAL LOW (ref 39.0–52.0)
Hemoglobin: 13 g/dL (ref 13.0–17.0)
MCH: 28.5 pg (ref 26.0–34.0)
MCHC: 33.9 g/dL (ref 30.0–36.0)
MCV: 84 fL (ref 80.0–100.0)
Platelets: 480 K/uL — ABNORMAL HIGH (ref 150–400)
RBC: 4.56 MIL/uL (ref 4.22–5.81)
RDW: 15.7 % — ABNORMAL HIGH (ref 11.5–15.5)
WBC: 10.3 K/uL (ref 4.0–10.5)
nRBC: 0 % (ref 0.0–0.2)

## 2023-08-22 LAB — MAGNESIUM: Magnesium: 2.1 mg/dL (ref 1.7–2.4)

## 2023-08-22 SURGERY — BRONCHOSCOPY, WITH FLUOROSCOPY
Anesthesia: General | Laterality: Right

## 2023-08-22 MED ORDER — OXYCODONE HCL 5 MG/5ML PO SOLN: 5.0000 mg | Freq: Once | ORAL | Status: DC | PRN

## 2023-08-22 MED ORDER — ONDANSETRON HCL 4 MG/2ML IJ SOLN: 4.0000 mg | Freq: Once | INTRAMUSCULAR | Status: DC | PRN

## 2023-08-22 MED ORDER — PHENYLEPHRINE HCL-NACL 20-0.9 MG/250ML-% IV SOLN
INTRAVENOUS | Status: DC | PRN
  Administered 2023-08-22: 40 ug/min via INTRAVENOUS

## 2023-08-22 MED ORDER — PROPOFOL 10 MG/ML IV BOLUS
INTRAVENOUS | Status: DC | PRN
  Administered 2023-08-22: 70 mg via INTRAVENOUS

## 2023-08-22 MED ORDER — PHENYLEPHRINE 80 MCG/ML (10ML) SYRINGE FOR IV PUSH (FOR BLOOD PRESSURE SUPPORT)
PREFILLED_SYRINGE | INTRAVENOUS | Status: DC | PRN
  Administered 2023-08-22 (×2): 160 ug via INTRAVENOUS

## 2023-08-22 MED ORDER — ONDANSETRON HCL 4 MG/2ML IJ SOLN
INTRAMUSCULAR | Status: DC | PRN
  Administered 2023-08-22: 4 mg via INTRAVENOUS

## 2023-08-22 MED ORDER — SODIUM CHLORIDE 0.9 % IV BOLUS
500.0000 mL | Freq: Once | INTRAVENOUS | Status: AC
  Administered 2023-08-22: 500 mL via INTRAVENOUS

## 2023-08-22 MED ORDER — LIDOCAINE 2% (20 MG/ML) 5 ML SYRINGE
INTRAMUSCULAR | Status: DC | PRN
Start: 2023-08-22 — End: 2023-08-22
  Administered 2023-08-22: 40 mg via INTRAVENOUS

## 2023-08-22 MED ORDER — OXYCODONE HCL 5 MG PO TABS: 5.0000 mg | ORAL_TABLET | Freq: Once | ORAL | Status: DC | PRN

## 2023-08-22 MED ORDER — SODIUM CHLORIDE 0.9 % IV SOLN
INTRAVENOUS | Status: AC | PRN
  Administered 2023-08-22: 500 mL via INTRAMUSCULAR

## 2023-08-22 MED ORDER — ROCURONIUM BROMIDE 10 MG/ML (PF) SYRINGE
PREFILLED_SYRINGE | INTRAVENOUS | Status: DC | PRN
  Administered 2023-08-22: 20 mg via INTRAVENOUS

## 2023-08-22 MED ORDER — CHLORHEXIDINE GLUCONATE 0.12 % MT SOLN
15.0000 mL | Freq: Once | OROMUCOSAL | Status: AC
  Administered 2023-08-22: 15 mL via OROMUCOSAL

## 2023-08-22 MED ORDER — MIDODRINE HCL 5 MG PO TABS
5.0000 mg | ORAL_TABLET | Freq: Three times a day (TID) | ORAL | Status: DC
  Administered 2023-08-22 – 2023-08-29 (×22): 5 mg via ORAL
  Filled 2023-08-22 (×23): qty 1

## 2023-08-22 MED ORDER — SUGAMMADEX SODIUM 200 MG/2ML IV SOLN
INTRAVENOUS | Status: DC | PRN
  Administered 2023-08-22: 100 mg via INTRAVENOUS

## 2023-08-22 MED ORDER — FENTANYL CITRATE (PF) 100 MCG/2ML IJ SOLN: 25.0000 ug | INTRAMUSCULAR | Status: DC | PRN

## 2023-08-22 MED ORDER — CHLORHEXIDINE GLUCONATE 0.12 % MT SOLN
OROMUCOSAL | Status: AC
  Filled 2023-08-22: qty 15

## 2023-08-22 NOTE — Anesthesia Postprocedure Evaluation (Signed)
 Anesthesia Post Note  Patient: Fred Wise  Procedure(s) Performed: BRONCHOSCOPY, WITH FLUOROSCOPY (Right)     Patient location during evaluation: Endoscopy Anesthesia Type: General Level of consciousness: awake and alert Pain management: pain level controlled Vital Signs Assessment: post-procedure vital signs reviewed and stable Respiratory status: spontaneous breathing, nonlabored ventilation, respiratory function stable and patient connected to nasal cannula oxygen  Cardiovascular status: blood pressure returned to baseline and stable Postop Assessment: no apparent nausea or vomiting Anesthetic complications: no   No notable events documented.  Last Vitals:  Vitals:   08/22/23 1306 08/22/23 1326  BP: 101/68   Pulse: (!) 104 (!) 101  Resp: (!) 29 (!) 24  Temp:    SpO2: 93% 92%    Last Pain:  Vitals:   08/22/23 1300  TempSrc:   PainSc: 0-No pain                 [name]

## 2023-08-22 NOTE — Interval H&P Note (Signed)
 History and Physical Interval Note:  08/22/2023 11:15 AM  Fred Wise  has presented today for surgery, with the diagnosis of pneumonia.  The various methods of treatment have been discussed with the patient and family. After consideration of risks, benefits and other options for treatment, the patient has consented to  Procedure(s): BRONCHOSCOPY, WITH FLUOROSCOPY (Right) as a surgical intervention.  The patient's history has been reviewed, patient examined, no change in status, stable for surgery.  I have reviewed the patient's chart and labs.  Questions were answered to the patient's satisfaction.     Fred Wise

## 2023-08-22 NOTE — Progress Notes (Signed)
 There was order for placing a PIV access. Patient was already off the floor for procedures. Informed patient's RN cancel the IV consult at this time. HS [employer]

## 2023-08-22 NOTE — Transfer of Care (Signed)
 Immediate Anesthesia Transfer of Care Note  Patient: Fred Wise  Procedure(s) Performed: BRONCHOSCOPY, WITH FLUOROSCOPY (Right)  Patient Location: Endoscopy Unit  Anesthesia Type:General  Level of Consciousness: awake  Airway & Oxygen  Therapy: Patient Spontanous Breathing and Patient connected to nasal cannula oxygen   Post-op Assessment: Report given to RN and Post -op Vital signs reviewed and stable  Post vital signs: Reviewed and stable  Last Vitals:  Vitals Value Taken Time  BP 90/57 08/22/23 12:51  Temp 36.6 C 08/22/23 12:43  Pulse 103 08/22/23 12:51  Resp 33 08/22/23 12:51  SpO2 92 % 08/22/23 12:51  Vitals shown include unfiled device data.  Last Pain:  Vitals:   08/22/23 1243  TempSrc: Temporal  PainSc: 0-No pain      Patients Stated Pain Goal: 0 (08/18/23 0600)  Complications: No notable events documented.

## 2023-08-22 NOTE — Progress Notes (Signed)
   08/22/23 0740  Assess: MEWS Score  Temp 98.5 F (36.9 C)  BP 100/60  MAP (mmHg) 73  Pulse Rate (!) 107  Resp 16  SpO2 93 %  O2 Device Room Air  Assess: MEWS Score  MEWS Temp 0  MEWS Systolic 1  MEWS Pulse 1  MEWS RR 0  MEWS LOC 0  MEWS Score 2  MEWS Score Color Yellow  Assess: if the MEWS score is Yellow or Red  Were vital signs accurate and taken at a resting state? Yes  Does the patient meet 2 or more of the SIRS criteria? No  MEWS guidelines implemented  Yes, yellow  Treat  MEWS Interventions Considered administering scheduled or prn medications/treatments as ordered  Take Vital Signs  Increase Vital Sign Frequency  Yellow: Q2hr x1, continue Q4hrs until patient remains green for 12hrs  Escalate  MEWS: Escalate Yellow: Discuss with charge nurse and consider notifying provider and/or RRT  Notify: Charge Nurse/RN  Name of Charge Nurse/RN Notified Burle Kwan [facility]  Provider Notification  Provider Name/Title Rai Ripudeep  Date Provider Notified 08/22/23  Time Provider Notified 0745  Method of Notification Page  Notification Reason Other (Comment)  Provider response See new orders  Date of Provider Response 08/22/23  Time of Provider Response 0745  Assess: SIRS CRITERIA  SIRS Temperature  0  SIRS Respirations  0  SIRS Pulse 1  SIRS WBC 0  SIRS Score Sum  1

## 2023-08-22 NOTE — Plan of Care (Signed)
  Problem: Education: Goal: Knowledge of General Education information will improve Description: Including pain rating scale, medication(s)/side effects and non-pharmacologic comfort measures Outcome: Progressing   Problem: Health Behavior/Discharge Planning: Goal: Ability to manage health-related needs will improve Outcome: Progressing   Problem: Clinical Measurements: Goal: Ability to maintain clinical measurements within normal limits will improve Outcome: Progressing   Problem: Activity: Goal: Risk for activity intolerance will decrease Outcome: Progressing   Problem: Coping: Goal: Level of anxiety will decrease Outcome: Progressing   Problem: Pain Managment: Goal: General experience of comfort will improve and/or be controlled Outcome: Progressing

## 2023-08-22 NOTE — Anesthesia Procedure Notes (Signed)
 Procedure Name: Intubation Date/Time: 08/22/2023 11:51 AM  Performed by: Virgil Ee, CRNAPre-anesthesia Checklist: Patient identified, Patient being monitored, Timeout performed, Emergency Drugs available and Suction available Patient Re-evaluated:Patient Re-evaluated prior to induction Oxygen  Delivery Method: Circle system utilized Preoxygenation: Pre-oxygenation with 100% oxygen  Induction Type: IV induction Ventilation: Mask ventilation without difficulty Laryngoscope Size: Mac and 4 Grade View: Grade I Tube type: Oral Tube size: 7.5 mm Number of attempts: 1 Airway Equipment and Method: Stylet Placement Confirmation: ETT inserted through vocal cords under direct vision, positive ETCO2 and breath sounds checked- equal and bilateral Secured at: 23 cm Tube secured with: Tape Dental Injury: Teeth and Oropharynx as per pre-operative assessment

## 2023-08-22 NOTE — Progress Notes (Signed)
 [facility]                                                                              Fred Wise, is a 68 y.o. male, DOB - 1955/02/27, FMW:[id number] Admit date - 08/16/2023    Outpatient Primary MD for the patient is [name], Fred HERO, MD  LOS - 6  days  Chief Complaint  Patient presents with   Shortness of Breath       Brief summary   Patient is a 68 year old male with HTN, smoker, failure to thrive, presented to the hospital with complaints of cough and shortness of breath. Found to have right lower lobe pneumonia as well as possible parapneumonic effusion. Pulmonary consulted. Underwent IR guided chest tube placement on 7/25.   Assessment & Plan      Right lower lobe pneumonia.  Multifocal pneumonia, Right pleural effusion with loculation. Possible lung mass. Post chest tube insertion pneumothorax -Pulmonology following, possible PNA versus malignancy  - Underwent IR guided chest tube insertion on 7/25. - LDH elevated suggesting exudative fluid.  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 -Pleural lytic therapy done by pulmonary on 7/26. -Bronchoscopy done today   Dysphagia. SLP evaluation, MBS completed, recommended dysphagia 2 diet, thin liquids   Acute metabolic encephalopathy. Likely in the setting of poor p.o. intake as well as infection. Mentation improving.  No focal deficit.    Former smoker. Quit 3 months ago.  Borderline BP with sinus tachycardia - Placed on gentle hydration, midodrine  5 mg 3 times daily  Severe protein calorie malnutrition, adult failure to thrive Nutrition Problem: Severe Malnutrition Etiology: chronic illness (COPD, arthritis)  Signs/Symptoms: severe fat depletion, severe muscle depletion, energy intake < or equal to 75% for > or equal to 1 month, percent weight loss Percent weight loss: 23 % (in 7  months) Interventions: Ensure Enlive (each supplement provides 350kcal and 20 grams of protein), Magic cup, Liberalize Diet  Underweight (BMI < 18.5) Estimated body mass index is 13.2 kg/m as calculated from the following:   Height as of this encounter: 6' 3 (1.905 m).   Weight as of this encounter: 47.9 kg.  Code Status: Full code DVT Prophylaxis:  enoxaparin  (LOVENOX ) injection 40 mg Start: 08/16/23 1100   Level of Care: Level of care: Telemetry Medical Family Communication: Updated patient Disposition Plan:      Remains inpatient appropriate:      Procedures:    Consultants:     Antimicrobials:   Anti-infectives (From admission, onward)    Start     Dose/Rate Route Frequency Ordered Stop   08/17/23 1000  cefTRIAXone  (ROCEPHIN ) 1 g in sodium chloride  0.9 % 100 mL IVPB  Status:  Discontinued        1 g 200 mL/hr over 30 Minutes Intravenous Daily 08/16/23 1049 08/16/23 1100   08/17/23 1000  cefTRIAXone  (ROCEPHIN ) 2 g in sodium chloride  0.9 % 100 mL IVPB        2 g 200 mL/hr over 30 Minutes Intravenous Every 24 hours 08/16/23 1049 08/20/23 1345   08/17/23 1000  azithromycin  (ZITHROMAX ) tablet  500 mg        500 mg Oral Daily 08/16/23 1049 08/20/23 0804   08/16/23 1115  cefTRIAXone  (ROCEPHIN ) 1 g in sodium chloride  0.9 % 100 mL IVPB        1 g 200 mL/hr over 30 Minutes Intravenous  Once 08/16/23 1100 08/16/23 1214   08/16/23 0715  cefTRIAXone  (ROCEPHIN ) 1 g in sodium chloride  0.9 % 100 mL IVPB        1 g 200 mL/hr over 30 Minutes Intravenous  Once 08/16/23 0711 08/16/23 0824   08/16/23 0715  azithromycin  (ZITHROMAX ) 500 mg in sodium chloride  0.9 % 250 mL IVPB        500 mg 250 mL/hr over 60 Minutes Intravenous  Once 08/16/23 0711 08/16/23 0921          Medications  acetaminophen   1,000 mg Oral Q8H   enoxaparin  (LOVENOX ) injection  40 mg Subcutaneous Q24H   feeding supplement  237 mL Oral BID BM   guaiFENesin   600 mg Oral BID   midodrine   5 mg Oral TID WC    senna-docusate  1 tablet Oral BID   sodium chloride  flush  10 mL Intrapleural Q8H   sodium chloride  flush  3 mL Intravenous Q12H      Subjective:   Fred Wise was seen and examined today.  Has some cough, BP soft this morning with tachycardia.  No acute chest pain, dizziness, nausea vomiting or abdominal pain.   Objective:   Vitals:   08/22/23 1300 08/22/23 1306 08/22/23 1326 08/22/23 1355  BP: 94/67 101/68  95/62  Pulse: (!) 102 (!) 104 (!) 101 (!) 106  Resp: (!) 26 (!) 29 (!) 24 16  Temp:    98.3 F (36.8 C)  TempSrc:    Oral  SpO2: 92% 93% 92% 95%  Weight:      Height:        Intake/Output Summary (Last 24 hours) at 08/22/2023 1358 Last data filed at 08/22/2023 1352 Gross per 24 hour  Intake 450 ml  Output 1100 ml  Net -650 ml     Wt Readings from Last 3 Encounters:  08/20/23 47.9 kg  10/31/21 58.5 kg  10/20/21 58.5 kg     Exam General: Alert and oriented x 3, NAD Cardiovascular: S1 S2 auscultated,  RRR Respiratory: Decreased BS right >left Gastrointestinal: Soft, nontender, nondistended, + bowel sounds Ext: no pedal edema bilaterally Neuro: No new deficits Psych: Normal affect     Data Reviewed:  I have personally reviewed following labs    CBC Lab Results  Component Value Date   WBC 10.3 08/22/2023   RBC 4.56 08/22/2023   HGB 13.0 08/22/2023   HCT 38.3 (L) 08/22/2023   MCV 84.0 08/22/2023   MCH 28.5 08/22/2023   PLT 480 (H) 08/22/2023   MCHC 33.9 08/22/2023   RDW 15.7 (H) 08/22/2023   LYMPHSABS 1.2 08/16/2023   MONOABS 0.9 08/16/2023   EOSABS 0.4 08/16/2023   BASOSABS 0.1 08/16/2023     Last metabolic panel Lab Results  Component Value Date   NA 132 (L) 08/22/2023   K 4.3 08/22/2023   CL 99 08/22/2023   CO2 25 08/22/2023   BUN 11 08/22/2023   CREATININE 0.51 (L) 08/22/2023   GLUCOSE 98 08/22/2023   GFRNONAA >60 08/22/2023   GFRAA >60 02/16/2018   CALCIUM 8.9 08/22/2023   PHOS 2.7 08/19/2023   PROT 6.0 (L) 08/16/2023    ALBUMIN 2.1 (L) 08/16/2023   BILITOT 1.3 (H)  08/16/2023   ALKPHOS 58 08/16/2023   AST 22 08/16/2023   ALT 14 08/16/2023   ANIONGAP 8 08/22/2023    CBG (last 3)  No results for input(s): GLUCAP in the last 72 hours.    Coagulation Profile: Recent Labs  Lab 08/17/23 0923  INR 1.1     Radiology Studies: I have personally reviewed the imaging studies  DG Chest Port 1 View Result Date: 08/22/2023 CLINICAL DATA:  Status post bronchoscopy EXAM: PORTABLE CHEST 1 VIEW COMPARISON:  X-ray 08/22/2023. FINDINGS: Extensive opacity of throughout the mid to lower right hemithorax with some volume loss and shift of the mediastinum from left-to-right. There is a pigtail catheter along the right lung base with a small right apical pneumothorax, similar to previous. Component pleural effusion is possible. Persistent interstitial and parenchymal opacity in left mid lower lung, slightly improved from previous. No left-sided pneumothorax or effusion. Normal cardiopericardial silhouette. Overlapping cardiac leads. IMPRESSION: Slight decrease in opacity at the left lung base with significant residual. Persistent diffuse right lung opacity with pneumothorax and pleural effusion as well as a pigtail catheter. Volume loss. Recommend follow-up. Electronically Signed   By: Ranell Bring M.D.   On: 08/22/2023 13:19   DG C-Arm 1-60 Min-No Report Result Date: 08/22/2023 Fluoroscopy was utilized by the requesting physician.  No radiographic interpretation.   DG CHEST PORT 1 VIEW Result Date: 08/22/2023 CLINICAL DATA:  142230 Pleural effusion 142230. EXAM: PORTABLE CHEST 1 VIEW COMPARISON:  08/21/2023. FINDINGS: Redemonstration of right apicolateral small pneumothorax without significant interval change. Stable positioning of the right pleural drainage catheter. Redemonstration of diffuse heterogeneous alveolar and interstitial opacities throughout bilateral lungs with relative sparing of the left upper mid lung zones.  Findings are nonspecific and differential diagnosis includes multi lobar pneumonia, ARDS or pulmonary edema. No left pneumothorax Persistent small to moderate right pleural effusion. No significant left pleural effusion. Evaluation of cardiomediastinal silhouette is nondiagnostic due to right lower hemithorax opacification. However, overall significant interval change since the prior study. No acute osseous abnormalities. The soft tissues are within normal limits. IMPRESSION: No significant interval change since the prior study. Electronically Signed   By: Ree Molt M.D.   On: 08/22/2023 08:49   DG Chest Port 1 View Result Date: 08/21/2023 CLINICAL DATA:  Pleural effusion EXAM: PORTABLE CHEST 1 VIEW COMPARISON:  08/20/2023 FINDINGS: Single frontal view of the chest demonstrates right-sided pigtail drainage catheter overlying the right costophrenic angle. Since the prior exam, there is decreased gas component of the right-sided hydropneumothorax, with increasing fluid within the lower right hemithorax. No mediastinal shift or tension effect. Patchy bibasilar airspace disease again noted unchanged. Cardiac silhouette is stable. No acute bony abnormalities. IMPRESSION: 1. Persistent right-sided hydropneumothorax, with stable position of the indwelling chest tube. Decreased gaseous component and increasing fluid component since prior study. No tension effect or midline shift. 2. Persistent basilar predominant airspace disease. Electronically Signed   By: Ozell Daring M.D.   On: 08/21/2023 12:33       Emory Gallentine M.D. [facility] 08/22/2023, 1:58 PM  Available via Epic secure chat 7am-7pm After 7 pm, please refer to night coverage provider listed on amion.

## 2023-08-22 NOTE — Plan of Care (Signed)
   Problem: Education: Goal: Knowledge of General Education information will improve Description: Including pain rating scale, medication(s)/side effects and non-pharmacologic comfort measures Outcome: Progressing   Problem: Activity: Goal: Risk for activity intolerance will decrease Outcome: Progressing   Problem: Nutrition: Goal: Adequate nutrition will be maintained Outcome: Progressing

## 2023-08-22 NOTE — Addendum Note (Signed)
 Addendum  created 08/22/23 1348 by Virgil Ee, CRNA   Flowsheet accepted, Intraprocedure Meds edited

## 2023-08-22 NOTE — Anesthesia Preprocedure Evaluation (Signed)
 Anesthesia Evaluation  Patient identified by MRN, date of birth, ID band Patient awake    Reviewed: Allergy & Precautions, NPO status , Patient's Chart, lab work & pertinent test results  History of Anesthesia Complications Negative for: history of anesthetic complications  Airway Mallampati: II  TM Distance: >3 FB Neck ROM: Full    Dental  (+) Upper Dentures, Lower Dentures   Pulmonary neg sleep apnea, pneumonia, unresolved, COPD, Patient abstained from smoking.Not current smoker   + rhonchi  + decreased breath sounds      Cardiovascular Exercise Tolerance: Good METShypertension, Pt. on medications (-) CAD and (-) Past MI (-) dysrhythmias  Rhythm:Regular Rate:Normal - Systolic murmurs    Neuro/Psych negative neurological ROS  negative psych ROS   GI/Hepatic ,neg GERD  ,,(+)     (-) substance abuse    Endo/Other  neg diabetes    Renal/GU negative Renal ROS     Musculoskeletal   Abdominal   Peds  Hematology   Anesthesia Other Findings Past Medical History: No date: Arthritis     Comment:  knees, uses wheelchair No date: Hypertension  Reproductive/Obstetrics                              Anesthesia Physical Anesthesia Plan  ASA: 3  Anesthesia Plan: General   Post-op Pain Management:    Induction: Intravenous  PONV Risk Score and Plan: 2 and Ondansetron  and Dexamethasone  Airway Management Planned: Oral ETT  Additional Equipment: None  Intra-op Plan:   Post-operative Plan: Extubation in OR  Informed Consent: I have reviewed the patients History and Physical, chart, labs and discussed the procedure including the risks, benefits and alternatives for the proposed anesthesia with the patient or authorized representative who has indicated his/her understanding and acceptance.     Dental advisory given  Plan Discussed with: CRNA and Surgeon  Anesthesia Plan Comments:  (Discussed risks of anesthesia with patient, including PONV, sore throat, lip/dental/eye damage. Rare risks discussed as well, such as cardiorespiratory and neurological sequelae, and allergic reactions. Discussed the role of CRNA in patient's perioperative care. Patient understands.)        Anesthesia Quick Evaluation

## 2023-08-22 NOTE — Op Note (Signed)
  Name:  [name] MRN:  [id number] DOB:  [birth year]  PROCEDURE NOTE  Procedure(s): Flexible bronchoscopy [phone]) Brushing ([id number]) of the RLL Bronchial alveolar lavage [phone]) of the RLL Transbronchial lung biopsy, single lobe (68367) of the RLL   Indications: Right lower lobe pneumonia with weight loss, pleural effusion negative cytology, concern for malignancy  Consent:  Written informed consent was obtained prior to the procedure. The risks of the procedure including coughing, bleeding and the small chance of lung puncture requiring chest tube were discussed in great detail. The benefits & alternatives including serial follow up were also discussed.  Anesthesia:  General endotracheal.  Procedure summary:  Appropriate equipment was assembled.  The patient was  identified as Fred Wise. Interim history obtained and brought to the endoscopy room. Safety timeout was performed. The patient was placed supine on the operating table, airway established and general anesthesia administered by Anesthesia team.   After the appropriate level of anesthesia was assured, flexible video bronchoscope was lubricated and inserted through the endotracheal tube.    Airway examination was performed bilaterally to subsegmental level.  Minimal bile-stained secretions were noted in the right airways, mucosa appeared normal .  Bronchus intermedius appeared narrow and could not be traversed   Flexible video bronchoscope was used again to perform BAL and brushings from right lower lobe using fluoroscopy transbronchial biopsies were also obtained x 4. After ensuring hemostasis , the bronchoscope was withdrawn.  The patient was extubated in endoroom and transferred to PACU. Post-procedure chest x-ray was ordered.  Specimens sent: Bronchial alveolar lavage specimen of the RLL for  microbiology and cytology. Brushings for cytology Transbronchial biopsy for pathology  Complications:  No immediate  complications were noted.  Hemodynamic parameters and oxygenation remained stable throughout the procedure.  Estimated blood loss:  Less then 10 mL.   Harden Staff MD. DEBE. [facility] Pager [phone] If no response call [phone]   08/22/2023 12:34 PM

## 2023-08-23 ENCOUNTER — Inpatient Hospital Stay (HOSPITAL_COMMUNITY)

## 2023-08-23 ENCOUNTER — Encounter (HOSPITAL_COMMUNITY): Payer: Self-pay | Admitting: Pulmonary Disease

## 2023-08-23 DIAGNOSIS — J90 Pleural effusion, not elsewhere classified: Secondary | ICD-10-CM | POA: Diagnosis not present

## 2023-08-23 DIAGNOSIS — G9340 Encephalopathy, unspecified: Secondary | ICD-10-CM | POA: Diagnosis not present

## 2023-08-23 DIAGNOSIS — J449 Chronic obstructive pulmonary disease, unspecified: Secondary | ICD-10-CM | POA: Diagnosis not present

## 2023-08-23 DIAGNOSIS — J189 Pneumonia, unspecified organism: Secondary | ICD-10-CM | POA: Diagnosis not present

## 2023-08-23 LAB — BASIC METABOLIC PANEL WITH GFR
Anion gap: 6 (ref 5–15)
BUN: 13 mg/dL (ref 8–23)
CO2: 29 mmol/L (ref 22–32)
Calcium: 8.9 mg/dL (ref 8.9–10.3)
Chloride: 99 mmol/L (ref 98–111)
Creatinine, Ser: 0.74 mg/dL (ref 0.61–1.24)
GFR, Estimated: >60 mL/min (ref >60)
Glucose, Bld: 104 mg/dL — ABNORMAL HIGH (ref 70–99)
Potassium: 5 mmol/L (ref 3.5–5.1)
Sodium: 134 mmol/L — ABNORMAL LOW (ref 135–145)

## 2023-08-23 LAB — CBC
HCT: 37.6 % — ABNORMAL LOW (ref 39.0–52.0)
Hemoglobin: 12.2 g/dL — ABNORMAL LOW (ref 13.0–17.0)
MCH: 27.9 pg (ref 26.0–34.0)
MCHC: 32.4 g/dL (ref 30.0–36.0)
MCV: 85.8 fL (ref 80.0–100.0)
Platelets: 523 K/uL — ABNORMAL HIGH (ref 150–400)
RBC: 4.38 MIL/uL (ref 4.22–5.81)
RDW: 15.9 % — ABNORMAL HIGH (ref 11.5–15.5)
WBC: 9.2 K/uL (ref 4.0–10.5)
nRBC: 0 % (ref 0.0–0.2)

## 2023-08-23 LAB — MAGNESIUM: Magnesium: 2 mg/dL (ref 1.7–2.4)

## 2023-08-23 NOTE — Progress Notes (Signed)
 [facility]                                                                              Fred Wise, is a 68 y.o. male, DOB - 1955-10-25, FMW:[id number] Admit date - 08/16/2023    Outpatient Primary MD for the patient is [name], [name], MD  LOS - 7  days  Chief Complaint  Patient presents with   Shortness of Breath       Brief summary   Patient is a 68 year old male with HTN, smoker, failure to thrive, presented to the hospital with complaints of cough and shortness of breath. Found to have right lower lobe pneumonia as well as possible parapneumonic effusion. Pulmonary consulted. Underwent IR guided chest tube placement on 7/25.   Assessment & Plan      Right lower lobe pneumonia.  Multifocal pneumonia, Right pleural effusion with loculation. Possible lung mass. Post chest tube insertion pneumothorax -Pulmonology following, possible PNA versus malignancy  - Underwent IR guided chest tube insertion on 7/25. - LDH elevated suggesting exudative fluid.  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 -Pleural lytic therapy done by pulmonary on 7/26. -Bronchoscopy completed 7/30   Dysphagia. SLP evaluation, MBS completed, recommended dysphagia 2 diet, thin liquids -Tolerating diet without difficulty   Acute metabolic encephalopathy. Likely in the setting of poor p.o. intake as well as infection. Mentation improving.  No focal deficit.    Former smoker. Quit 3 months ago.  Borderline BP with mild sinus tachycardia - Continue midodrine  5 mg 3 times daily  Severe protein calorie malnutrition, adult failure to thrive Nutrition Problem: Severe Malnutrition Etiology: chronic illness (COPD, arthritis)  Signs/Symptoms: severe fat depletion, severe muscle depletion, energy intake < or equal to 75% for > or equal to 1 month, percent weight loss Percent weight loss: 23  % (in 7 months) Interventions: Ensure Enlive (each supplement provides 350kcal and 20 grams of protein), Magic cup, Liberalize Diet  Underweight (BMI < 18.5) Estimated body mass index is 13.2 kg/m as calculated from the following:   Height as of this encounter: 6' 3 (1.905 m).   Weight as of this encounter: 47.9 kg.  Code Status: Full code DVT Prophylaxis:  enoxaparin  (LOVENOX ) injection 40 mg Start: 08/16/23 1100   Level of Care: Level of care: Telemetry Medical Family Communication: Updated patient Disposition Plan:      Remains inpatient appropriate:      Procedures:    Consultants:     Antimicrobials:   Anti-infectives (From admission, onward)    Start     Dose/Rate Route Frequency Ordered Stop   08/17/23 1000  cefTRIAXone  (ROCEPHIN ) 1 g in sodium chloride  0.9 % 100 mL IVPB  Status:  Discontinued        1 g 200 mL/hr over 30 Minutes Intravenous Daily 08/16/23 1049 08/16/23 1100   08/17/23 1000  cefTRIAXone  (ROCEPHIN ) 2 g in sodium chloride  0.9 % 100 mL IVPB        2 g 200 mL/hr over 30 Minutes Intravenous Every 24 hours 08/16/23 1049 08/20/23 1345   08/17/23 1000  azithromycin  (  ZITHROMAX ) tablet 500 mg        500 mg Oral Daily 08/16/23 1049 08/20/23 0804   08/16/23 1115  cefTRIAXone  (ROCEPHIN ) 1 g in sodium chloride  0.9 % 100 mL IVPB        1 g 200 mL/hr over 30 Minutes Intravenous  Once 08/16/23 1100 08/16/23 1214   08/16/23 0715  cefTRIAXone  (ROCEPHIN ) 1 g in sodium chloride  0.9 % 100 mL IVPB        1 g 200 mL/hr over 30 Minutes Intravenous  Once 08/16/23 0711 08/16/23 0824   08/16/23 0715  azithromycin  (ZITHROMAX ) 500 mg in sodium chloride  0.9 % 250 mL IVPB        500 mg 250 mL/hr over 60 Minutes Intravenous  Once 08/16/23 0711 08/16/23 0921          Medications  acetaminophen   1,000 mg Oral Q8H   enoxaparin  (LOVENOX ) injection  40 mg Subcutaneous Q24H   feeding supplement  237 mL Oral BID BM   guaiFENesin   600 mg Oral BID   midodrine   5 mg Oral TID  WC   senna-docusate  1 tablet Oral BID   sodium chloride  flush  10 mL Intrapleural Q8H   sodium chloride  flush  3 mL Intravenous Q12H      Subjective:   Fred Wise was seen and examined today.  No acute complaints, eating breakfast, wants to go home.  No chest pain, shortness of breath, dizziness or lightheadedness.  Objective:   Vitals:   08/22/23 1934 08/23/23 0006 08/23/23 0410 08/23/23 0845  BP: (!) 96/58 100/75 113/67 (!) 102/58  Pulse:  100 (!) 105   Resp: 16 14 14 16   Temp: 97.7 F (36.5 C) 98.3 F (36.8 C) 98.3 F (36.8 C) (!) 97.5 F (36.4 C)  TempSrc: Oral     SpO2: 95% 95% 94% 96%  Weight:      Height:        Intake/Output Summary (Last 24 hours) at 08/23/2023 1212 Last data filed at 08/23/2023 1100 Gross per 24 hour  Intake 590 ml  Output 700 ml  Net -110 ml     Wt Readings from Last 3 Encounters:  08/20/23 47.9 kg  10/31/21 58.5 kg  10/20/21 58.5 kg   Physical Exam General: Alert and oriented x 3, NAD, cachectic Cardiovascular: S1 S2 clear, RRR.  Respiratory: CTAB, no wheezing Gastrointestinal: Soft, nontender, nondistended, NBS Ext: no pedal edema bilaterally Neuro: no new deficits Psych: Normal affect       Data Reviewed:  I have personally reviewed following labs    CBC Lab Results  Component Value Date   WBC 9.2 08/23/2023   RBC 4.38 08/23/2023   HGB 12.2 (L) 08/23/2023   HCT 37.6 (L) 08/23/2023   MCV 85.8 08/23/2023   MCH 27.9 08/23/2023   PLT 523 (H) 08/23/2023   MCHC 32.4 08/23/2023   RDW 15.9 (H) 08/23/2023   LYMPHSABS 1.2 08/16/2023   MONOABS 0.9 08/16/2023   EOSABS 0.4 08/16/2023   BASOSABS 0.1 08/16/2023     Last metabolic panel Lab Results  Component Value Date   NA 134 (L) 08/23/2023   K 5.0 08/23/2023   CL 99 08/23/2023   CO2 29 08/23/2023   BUN 13 08/23/2023   CREATININE 0.74 08/23/2023   GLUCOSE 104 (H) 08/23/2023   GFRNONAA >60 08/23/2023   GFRAA >60 02/16/2018   CALCIUM 8.9 08/23/2023   PHOS  2.7 08/19/2023   PROT 6.0 (L) 08/16/2023   ALBUMIN 2.1 (L)  08/16/2023   BILITOT 1.3 (H) 08/16/2023   ALKPHOS 58 08/16/2023   AST 22 08/16/2023   ALT 14 08/16/2023   ANIONGAP 6 08/23/2023    CBG (last 3)  No results for input(s): GLUCAP in the last 72 hours.    Coagulation Profile: Recent Labs  Lab 08/17/23 0923  INR 1.1     Radiology Studies: I have personally reviewed the imaging studies  DG CHEST PORT 1 VIEW Result Date: 08/23/2023 CLINICAL DATA:  68 year old male with abnormal right lung. EXAM: PORTABLE CHEST 1 VIEW COMPARISON:  Chest CT 08/16/2023. FINDINGS: Portable AP upright view at 0815 hours. Ongoing dense plea opacified right mid and lower lung. Right pleural drain remains in place, new from prior CTA and stable since yesterday. Right lung ventilation only modestly improved compared to CTA chest scout view 08/16/2023. Right pneumothorax is small to moderate, stable since yesterday and probably related to poor expansion of the abnormal right lung. Stable contralateral left lung with emphysema and less pronounced left lower lobe airspace opacity. Paucity of bowel gas.  Stable visualized osseous structures. IMPRESSION: 1. Stable since yesterday. Right pleural drain with small to moderate pneumothorax likely due to poor re-expansion of abnormal right lung. Right lung ventilation only modestly improved from abnormal Chest CTA 08/16/2023 (please see that report). 2. Stable left lower lobe pneumonia. 3. No new cardiopulmonary abnormality. Electronically Signed   By: VEAR Hurst M.D.   On: 08/23/2023 10:41   DG Chest Port 1 View Result Date: 08/22/2023 CLINICAL DATA:  Status post bronchoscopy EXAM: PORTABLE CHEST 1 VIEW COMPARISON:  X-ray 08/22/2023. FINDINGS: Extensive opacity of throughout the mid to lower right hemithorax with some volume loss and shift of the mediastinum from left-to-right. There is a pigtail catheter along the right lung base with a small right apical pneumothorax,  similar to previous. Component pleural effusion is possible. Persistent interstitial and parenchymal opacity in left mid lower lung, slightly improved from previous. No left-sided pneumothorax or effusion. Normal cardiopericardial silhouette. Overlapping cardiac leads. IMPRESSION: Slight decrease in opacity at the left lung base with significant residual. Persistent diffuse right lung opacity with pneumothorax and pleural effusion as well as a pigtail catheter. Volume loss. Recommend follow-up. Electronically Signed   By: Ranell Bring M.D.   On: 08/22/2023 13:19   DG C-Arm 1-60 Min-No Report Result Date: 08/22/2023 Fluoroscopy was utilized by the requesting physician.  No radiographic interpretation.   DG CHEST PORT 1 VIEW Result Date: 08/22/2023 CLINICAL DATA:  142230 Pleural effusion 142230. EXAM: PORTABLE CHEST 1 VIEW COMPARISON:  08/21/2023. FINDINGS: Redemonstration of right apicolateral small pneumothorax without significant interval change. Stable positioning of the right pleural drainage catheter. Redemonstration of diffuse heterogeneous alveolar and interstitial opacities throughout bilateral lungs with relative sparing of the left upper mid lung zones. Findings are nonspecific and differential diagnosis includes multi lobar pneumonia, ARDS or pulmonary edema. No left pneumothorax Persistent small to moderate right pleural effusion. No significant left pleural effusion. Evaluation of cardiomediastinal silhouette is nondiagnostic due to right lower hemithorax opacification. However, overall significant interval change since the prior Wise. No acute osseous abnormalities. The soft tissues are within normal limits. IMPRESSION: No significant interval change since the prior Wise. Electronically Signed   By: Ree Molt M.D.   On: 08/22/2023 08:49       Blakleigh Straw M.D. [facility] 08/23/2023, 12:12 PM  Available via Epic secure chat 7am-7pm After 7 pm, please refer to night coverage  provider listed on amion.

## 2023-08-23 NOTE — Plan of Care (Signed)
?  Problem: Clinical Measurements: ?Goal: Will remain free from infection ?Outcome: Progressing ?Goal: Diagnostic test results will improve ?Outcome: Progressing ?Goal: Respiratory complications will improve ?Outcome: Progressing ?  ?

## 2023-08-23 NOTE — Progress Notes (Signed)
 Speech Language Pathology Treatment: Dysphagia  Patient Details Name: Fred Wise MRN: [id number] DOB: 11/01/1955 Today's Date: 08/23/2023 Time: 1207-1220 SLP Time Calculation (min) (ACUTE ONLY): 13 min  Assessment / Plan / Recommendation Clinical Impression  Patient seen by SLP for skilled treatment focused on dysphagia goals. He was awake, sitting up in bed with phone in hand. He told SLP he was trying to call his brother. He then asked SLP to call his brother and tell him to come over here. I need to talk to him. SLP called patient's brother Fred Wise and left HIPAA compliant voicemail. Patient was receptive to having sips of Ensure shake, drinking consecutive sips before handing to SLP. No overt s/s aspiration observed. Patient without complaints of dysphagia however his cognition seems impaired. He asked SLP, do I call 911 and have the ambulance pick me up here? RN reported he has been asking about going home repeatedly. SLP recommending to continue on Dys 2 (minced) solids, thin liquids and no further SLP intervention warranted at this time.    HPI HPI: Pt is a 68 yr old male who presented by EMS from home to [facility] emergency department for shortness of breath, which has been going on for about over a week per her report. CT Chest 7/24: 2. Consolidation of the entire right lower lobe and right middle  lobe, as well as much of the posterior portion of the right upper  lobe, with associated air bronchograms. Consolidation and scattered  nodularity along with varicoid bronchiectasis in the left lower  lobe. Some of the areas of nodularity are probably areas of cystic  bronchiectasis. Appearance compatible with multilobar pneumonia.  Strictly speaking a right lung mass particularly in the right lower  lobe cannot be excluded given the degree airspace opacity and airway  truncation.  3. Scattered nodules in the left lung. Given the complex and  extensive nature findings in this case, I recommend  followup PA and  lateral chest radiography in 4 weeks time after appropriate  antibiotic therapy to reassess the lungs, and consideration of  noncontrast chest CT or PET-CT in 1-3 months to reassess the  underlying nodularity. Pt with pmhx significant of COPD, HTN, arthritis, and tobacco abuse (quit smoking 3 months ago).      SLP Plan  Discharge SLP treatment due to (comment);All goals met          Recommendations  Diet recommendations: Dysphagia 2 (fine chop);Thin liquid Liquids provided via: Cup;No straw Medication Administration: Whole meds with puree Supervision: Patient able to self feed Compensations: Minimize environmental distractions;Slow rate;Small sips/bites;Follow solids with liquid;Multiple dry swallows after each bite/sip Postural Changes and/or Swallow Maneuvers: Seated upright 90 degrees;Upright 30-60 min after meal                  Oral care BID   None Dysphagia, pharyngoesophageal phase (R13.14)     Discharge SLP treatment due to (comment);All goals met    Fred IVAR Blase, MA, CCC-SLP Speech Therapy

## 2023-08-23 NOTE — Progress Notes (Signed)
 NAME:  Fred Wise, MRN:  [id number], DOB:  [birth year], LOS: 7 ADMISSION DATE:  08/16/2023, CONSULTATION DATE: 7/24 REFERRING MD: MD Claudene with [facility] CHIEF COMPLAINT: Shortness of breath  History of Present Illness:  Pt is a 68 yr old male with pmhx significant of COPD, HTN, arthritis, and tobacco abuse (quit smoking 3 months ago) presents by EMS from home to [facility] emergency department for shortness of breath, which has been going on for about over a week per her report. Initial workup in the emergency department patient was given Solu-Medrol  125 mg IV and O2 saturations were low 90s on room air, send not needing oxygen  at this standpoint.  Noted to patient to have tachypnea, tachycardia, and blood pressure mainly normotensive to slightly hypertensive.  Initial lab work revealed sodium of 136, potassium of 4.5, creatinine 0.75, BUN 16, WBC of 11.7, hemoglobin 13.6, BNP 69, troponin high-sensitivity 11, procalcitonin 0.57  Chest x-ray-indicated dense consolidation of the right lung along with right pleural effusion CT angio also completed-negative for PE, but concern for multilobar pneumonia, moderate side pleural effusion and and scattered nodules within the left lung-noted to be complex/extensive recommendations by radiologist for repeat imaging within 4 weeks.   PCCM consulted for possible bronc/Thora in regards to CT scan findings.  Upon assessing patient, history was difficult to obtain from patient due to orientation being off.  Patient could tell us  that he was in the hospital but not but not and not really why that he was in the hospital.  Most history was obtained by brother at bedside along with family friend.  Per patient's brother, patient started having increased phlegm over the last week which was thick and white in consistency.  Also brother endorsed that patient has been coughing more often as well as had significant weight loss since Christmas but unable to tell amount of weight  lost.  Brother believes that patient has quit smoking over the last 2 to 3 months.  Patient's brother and family, could only tell me that patient may have been smoking for at least 1 pack a day for 30 to 40 years.  Brother and family member denies patient having chills, fever, any sick contacts, and or significant bleeding.  Brother also stated that patient did drive to doctor's appointment this past Tuesday and fell in the parking lot.  Not able to determine if this was witnessed or not.  In regards to the mentation/lack of orientation, patient's brother stated that mentation has been getting worse over the last couple months  Pertinent  Medical History   Past Medical History:  Diagnosis Date   Arthritis    knees, uses wheelchair   Hypertension      Significant Hospital Events: Including procedures, antibiotic start and stop dates in addition to other pertinent events   7/24: pccm consult for loculated effusion  7/25:  IR pigtail CT placement >> serosanguinous fluid 7/26 >> About 1 L serosanguineous fluid, lytics 7/30 bronchoscopy >> narrowing of right lower lobe bronchus, could not be traversed, brushings BAL transbronchial biopsy   Interim History / Subjective:   Denies CP/ dyspnea 100 cc drainage, clear Wants to go home.,  Has to pay rent  Objective    Blood pressure (!) 102/58, pulse (!) 105, temperature (!) 97.5 F (36.4 C), resp. rate 16, height 6' 3 (1.905 m), weight 47.9 kg, SpO2 96%.        Intake/Output Summary (Last 24 hours) at 08/23/2023 1321 Last data filed at 08/23/2023 1100  Gross per 24 hour  Intake 140 ml  Output 900 ml  Net -760 ml   Filed Weights   08/16/23 1647 08/17/23 1152 08/20/23 1337  Weight: 44 kg 44 kg 47.9 kg    Examination: General: older male, cachectic, no distress , on room air HENT: ncat, perrla, mmm Lungs: No accessory muscle use, no airleak on chest tube, decreased breath sounds on right Cardiovascular: s1s2 no m/r/g Abdomen:  Scaphoid soft nontender   Extremities: cachectic  Neuro: alert, oriented to place and time.  Grossly nonfocal    Labs show decreased leukocytosis, hyponatremia, improved? Chest x-ray shows stable right pneumothorax, fluid has collected again  Resolved problem list   Assessment and Plan  Multilobar pneumonia/CAP Concern for malignancy Weight loss - unknown over months  Right parapneumonic effusion status post pigtail >> right pneumothorax?  Indicating trapped lung COPD CTA chest negative for PE. Does show RLL pneumonia, loculated R effusion. He has emphysema on imaging.  WBC 11.7, procal 0.57. HIV negative. urine negative for strep/Legionella COVID/flu/RSV negative.   - Await bronchoscopy data, right lower lobe concerning for malignancy.  No endobronchial lesions visualized - Completed azithromycin  and rocephin  for CAP coverage   Pleural fluid exudative by LDH, low protein, lymphocytic  - Pleural fluid cytology negative x 1 -No more lytics for now - con't mucinex , duonebs,  - flutter valve   right Hydropneumothorax?  Indicating trapped lung  -Keep pigtail to suction , increase to 30 cm - Flush every shift  Other Hospital problems managed by primary Altered mental status -resolved, CT head  negative.  Hypertension Arthritis Tobacco abuse  I updated patient and his brother about my concern for malignancy based on bronchoscopy.  Brother was able to convince the patient to stay in the hospital longer   Best Practice (right click and Reselect all SmartList Selections daily)   Per Primary   Labs   CBC: Recent Labs  Lab 08/19/23 0706 08/20/23 0718 08/21/23 0607 08/22/23 0553 08/23/23 0636  WBC 11.1* 11.0* 10.5 10.3 9.2  HGB 13.6 13.0 12.3* 13.0 12.2*  HCT 41.0 39.3 35.8* 38.3* 37.6*  MCV 84.5 83.8 84.0 84.0 85.8  PLT 409* 447* 421* 480* 523*    Basic Metabolic Panel: Recent Labs  Lab 08/19/23 0706 08/20/23 0718 08/21/23 0607 08/22/23 0553  08/23/23 0636  NA 134* 131* 131* 132* 134*  K 4.5 3.9 3.9 4.3 5.0  CL 99 98 97* 99 99  CO2 24 23 25 25 29   GLUCOSE 91 96 92 98 104*  BUN 15 15 18 11 13   CREATININE 0.57* 0.65 0.54* 0.51* 0.74  CALCIUM 8.7* 8.8* 8.7* 8.9 8.9  MG 2.1 2.0 2.0 2.1 2.0  PHOS 2.7  --   --   --   --    GFR: Estimated Creatinine Clearance: 59.9 mL/min (by C-G formula based on SCr of 0.74 mg/dL). Recent Labs  Lab 08/20/23 0718 08/21/23 0607 08/22/23 0553 08/23/23 0636  WBC 11.0* 10.5 10.3 9.2    Liver Function Tests: No results for input(s): AST, ALT, ALKPHOS, BILITOT, PROT, ALBUMIN in the last 168 hours.  No results for input(s): LIPASE, AMYLASE in the last 168 hours. No results for input(s): AMMONIA in the last 168 hours.  ABG No results found for: PHART, PCO2ART, PO2ART, HCO3, TCO2, ACIDBASEDEF, O2SAT   Coagulation Profile: Recent Labs  Lab 08/17/23 0923  INR 1.1    Cardiac Enzymes: No results for input(s): CKTOTAL, CKMB, CKMBINDEX, TROPONINI in the last 168 hours.  HbA1C: No  results found for: HGBA1C  CBG: No results for input(s): GLUCAP in the last 168 hours.   Harden ROCKFORD Jude, MD [facility] 08/23/23 1:21 PM  Please see [email or web address] for pager details.  From 7A-7P if no response, please call [phone] After hours, please call ELink [phone]

## 2023-08-23 NOTE — Plan of Care (Signed)
   Problem: Education: Goal: Knowledge of General Education information will improve Description: Including pain rating scale, medication(s)/side effects and non-pharmacologic comfort measures Outcome: Progressing   Problem: Health Behavior/Discharge Planning: Goal: Ability to manage health-related needs will improve Outcome: Progressing   Problem: Clinical Measurements: Goal: Ability to maintain clinical measurements within normal limits will improve Outcome: Progressing   Problem: Nutrition: Goal: Adequate nutrition will be maintained Outcome: Progressing   Problem: Coping: Goal: Level of anxiety will decrease Outcome: Progressing   Problem: Safety: Goal: Ability to remain free from injury will improve Outcome: Progressing

## 2023-08-24 ENCOUNTER — Inpatient Hospital Stay (HOSPITAL_COMMUNITY)

## 2023-08-24 DIAGNOSIS — J90 Pleural effusion, not elsewhere classified: Secondary | ICD-10-CM | POA: Diagnosis not present

## 2023-08-24 DIAGNOSIS — R1310 Dysphagia, unspecified: Secondary | ICD-10-CM

## 2023-08-24 DIAGNOSIS — G9340 Encephalopathy, unspecified: Secondary | ICD-10-CM | POA: Diagnosis not present

## 2023-08-24 DIAGNOSIS — J449 Chronic obstructive pulmonary disease, unspecified: Secondary | ICD-10-CM | POA: Diagnosis not present

## 2023-08-24 DIAGNOSIS — R634 Abnormal weight loss: Secondary | ICD-10-CM | POA: Diagnosis not present

## 2023-08-24 DIAGNOSIS — J939 Pneumothorax, unspecified: Secondary | ICD-10-CM

## 2023-08-24 DIAGNOSIS — J189 Pneumonia, unspecified organism: Secondary | ICD-10-CM | POA: Diagnosis not present

## 2023-08-24 LAB — CULTURE, BAL-QUANTITATIVE W GRAM STAIN
Culture: NO GROWTH
Gram Stain: NONE SEEN

## 2023-08-24 LAB — ACID FAST SMEAR (AFB, MYCOBACTERIA): Acid Fast Smear: NEGATIVE

## 2023-08-24 NOTE — Plan of Care (Signed)

## 2023-08-24 NOTE — TOC Initial Note (Addendum)
 Transition of Care [facility]) - Initial/Assessment Note    Patient Details  Name: Fred Wise MRN: [id number] Date of Birth: 09/03/1955  Transition of Care [facility]) CM/SW Contact:    Fred Jon Bloch, RN Phone Number: 08/24/2023, 1:51 PM  Clinical Narrative:                 Presents with SOB, 7.24. Wise course: R lower lobe pneumonia.  Multifocal pneumonia, Right pleural effusion with loculation, ?  lung mass. - S/p  chest tube ( pigtail) insertion, pneumothorax 7/25  - Pleural lytic therapy,7/26.  - Bronchoscopy  7/30, bx pending  From home alone. Resides in an apartment. PTA independent with ADL's. Owns W/C.  Pt gives NCM permission to speak with  brother Fred regarding d/c planning. Fred states pt has loss significant weight within the past 4 months and will need help when d/c. States 63+ y/o mom can't help and he and his wife both with medical concerns can't help. 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 [facility] [place] [place] see if pt has any [facility] benefits. Voice message left.  08/24/2023 @ 1426 [facility] [place] April informed NCM pt is 20% service connected, PCP: Fred Wise), SW [place] [phone] ext. [phone]), Oncology  SW Devere Koyanagi [phone] if needed)  [facility] team following and will assist with needs.  Expected Discharge Plan: Home/Self Care Barriers to Discharge: Continued Medical Work up   Patient Goals and CMS Choice            Expected Discharge Plan and Services   Discharge Planning Services: CM Consult   Living arrangements for the past 2 months: Apartment                                      Prior Living Arrangements/Services Living arrangements for the past 2 months: Apartment Lives with:: Self Patient language and need for interpreter reviewed:: Yes Do you feel safe going back to the place where you live?: Yes       Need for Family Participation in Patient Care: Yes (Comment) Care giver support system in place?: No (comment)   Criminal Activity/Legal Involvement Pertinent to Current Situation/Hospitalization: No - Comment as needed  Activities of Daily Living      Permission Sought/Granted   Permission granted to share information with : Yes, Verbal Permission Granted  Share Information with NAME: Fred Wise  Brother  [phone]           Emotional Assessment       Orientation: : Oriented to Self, Oriented to Place, Oriented to  Time, Oriented to Situation   Psych Involvement: No (comment)  Admission diagnosis:  CAP (community acquired pneumonia) [J18.9] Patient Active Problem List   Diagnosis Date Noted   Multifocal pneumonia 08/16/2023   Leukocytosis 08/16/2023   Pleural effusion 08/16/2023   Acute encephalopathy 08/16/2023   Weight loss 08/16/2023   Tobacco abuse 08/16/2023   Thrombocytosis 08/16/2023   Underweight (BMI < 18.5) 08/16/2023   Protein-calorie malnutrition, severe (HCC) 02/17/2018   Influenza A 02/16/2018   Hypoxia 02/16/2018   Essential hypertension 02/16/2018   PCP:  Fred Wise Pharmacy:   [facility] [phone] [address], [place] - [address] AT [facility] [address] [place] [address] Phone: [phone] Fax: [phone]  [facility] [address] [place] [address] Phone: [phone] Fax: [phone]     Social Drivers of Health (SDOH) Social History: SDOH Screenings   Food Insecurity: Food Insecurity Present (08/17/2023)  Housing: Low Risk  (08/17/2023)  Transportation Needs: Unknown (08/17/2023)  Utilities: Not At Risk (08/17/2023)  Social Connections: Socially Isolated (08/17/2023)  Tobacco Use: High Risk (08/22/2023)   SDOH Interventions:     Readmission Risk Interventions     No data to display

## 2023-08-24 NOTE — Plan of Care (Signed)
  Problem: Clinical Measurements: Goal: Ability to maintain clinical measurements within normal limits will improve Outcome: Progressing Goal: Will remain free from infection Outcome: Progressing Goal: Diagnostic test results will improve Outcome: Progressing   Problem: Activity: Goal: Risk for activity intolerance will decrease Outcome: Progressing   Problem: Nutrition: Goal: Adequate nutrition will be maintained Outcome: Progressing   Problem: Coping: Goal: Level of anxiety will decrease Outcome: Progressing   

## 2023-08-24 NOTE — Progress Notes (Signed)
 NAME:  Fred Wise, MRN:  [id number], DOB:  11-27-1955, LOS: 8 ADMISSION DATE:  08/16/2023, CONSULTATION DATE: 7/24 REFERRING MD: MD Claudene with [facility] CHIEF COMPLAINT: Shortness of breath  History of Present Illness:  Pt is a 68 yr old male with pmhx significant of COPD, HTN, arthritis, and tobacco abuse (quit smoking 3 months ago) presents by EMS from home to [facility] emergency department for shortness of breath, which has been going on for about over a week per her report. Initial workup in the emergency department patient was given Solu-Medrol  125 mg IV and O2 saturations were low 90s on room air, send not needing oxygen  at this standpoint.  Noted to patient to have tachypnea, tachycardia, and blood pressure mainly normotensive to slightly hypertensive.  Initial lab work revealed sodium of 136, potassium of 4.5, creatinine 0.75, BUN 16, WBC of 11.7, hemoglobin 13.6, BNP 69, troponin high-sensitivity 11, procalcitonin 0.57  Chest x-ray-indicated dense consolidation of the right lung along with right pleural effusion CT angio also completed-negative for PE, but concern for multilobar pneumonia, moderate side pleural effusion and and scattered nodules within the left lung-noted to be complex/extensive recommendations by radiologist for repeat imaging within 4 weeks.   PCCM consulted for possible bronc/Thora in regards to CT scan findings.  Upon assessing patient, history was difficult to obtain from patient due to orientation being off.  Patient could tell us  that he was in the hospital but not but not and not really why that he was in the hospital.  Most history was obtained by brother at bedside along with family friend.  Per patient's brother, patient started having increased phlegm over the last week which was thick and white in consistency.  Also brother endorsed that patient has been coughing more often as well as had significant weight loss since Christmas but unable to tell amount of weight  lost.  Brother believes that patient has quit smoking over the last 2 to 3 months.  Patient's brother and family, could only tell me that patient may have been smoking for at least 1 pack a day for 30 to 40 years.  Brother and family member denies patient having chills, fever, any sick contacts, and or significant bleeding.  Brother also stated that patient did drive to doctor's appointment this past Tuesday and fell in the parking lot.  Not able to determine if this was witnessed or not.  In regards to the mentation/lack of orientation, patient's brother stated that mentation has been getting worse over the last couple months  Pertinent  Medical History   Past Medical History:  Diagnosis Date   Arthritis    knees, uses wheelchair   Hypertension      Significant Hospital Events: Including procedures, antibiotic start and stop dates in addition to other pertinent events   7/24: pccm consult for loculated effusion  7/25:  IR pigtail CT placement >> serosanguinous fluid 7/26 >> About 1 L serosanguineous fluid, lytics 7/30 bronchoscopy >> narrowing of right lower lobe bronchus, could not be traversed, brushings BAL transbronchial biopsy   Interim History / Subjective:   No overnight issues. 75cc in the chest tube output charted.   Objective    Blood pressure (!) 101/58, pulse (!) 110, temperature 97.6 F (36.4 C), resp. rate 17, height 6' 3 (1.905 m), weight 47.9 kg, SpO2 93%.        Intake/Output Summary (Last 24 hours) at 08/24/2023 0844 Last data filed at 08/24/2023 0512 Gross per 24 hour  Intake  380 ml  Output 1075 ml  Net -695 ml   Filed Weights   08/16/23 1647 08/17/23 1152 08/20/23 1337  Weight: 44 kg 44 kg 47.9 kg    Examination: Frail, cachectic on nasal cannula Decreased muscle bulk and tone Awake, alert, oriented, normal speech Breath sounds diminished bilateral bases, no wheeze no increased wob Tachycardic, regular, No edema  Labs - pleural fluid shows  exudative effusion Chest xray yesterday shows right apical pneumothorax, extensive RLL and RML airpsace disease Some LLL air space disease   Resolved problem list   Assessment and Plan  Multilobar pneumonia/CAP Concern for malignancy Weight loss - unknown over months  Right parapneumonic effusion status post pigtail >>hydropneumothorax Pleural fluid exudative by LDH, low protein, lymphocytic COPD, recent tobacco use cessation Dysphagia CT Chest shows extensive bilateral lower lobe airspace disease R>L> bilious secretions noted during bronchoscopy. I am mostly concerned for malignancy vs aspiration given his ongoing dysphagia. If malignancy work up is negative he'll need prolonged abx course with augmentin for aspiration and follow up ct scan with office appt.  - Pleural fluid cytology negative x 1 - con't mucinex , duonebs,  - flutter valve - will obtain CT Chest today to further evaluate pleural space. Drainage has decreased. If it seems like lung is not going to re-expand will go ahead and pull chest tube as he doesn't have an ongoing air leak. He is not a thoracic surgical candidate given his severe debility and cachexia. His BMI is 13.  -Keep pigtail to suction, ok to take off suction for ct chest - Flush every shift  Other Hospital problems managed by primary Altered mental status -resolved, CT head  negative.  Hypertension Arthritis Tobacco abuse  Verdon Gore, MD Pulmonary and Critical Care Medicine [facility] 08/24/2023 8:53 AM Pager: see AMION  If no response to pager, please call critical care on call (see AMION) until 7pm After 7:00 pm call Elink

## 2023-08-24 NOTE — Progress Notes (Signed)
 At approximately 0304, notified per telemetry of 3 beat run of nonsustained VT.  Upon immediate arrival to bedside, patient asymptomatic and resting quietly with no sign of distress.  HR 113, other vitals stable.  Mahima, CN made aware. Dr Andrez notified via secure chat, awaiting response.

## 2023-08-24 NOTE — Progress Notes (Signed)
 [facility]                                                                              Fred Wise, is a 68 y.o. male, DOB - 08/05/1955, FMW:[id number] Admit date - 08/16/2023    Outpatient Primary MD for the patient is [name], [name], MD  LOS - 8  days  Chief Complaint  Patient presents with   Shortness of Breath       Brief summary   Patient is a 68 year old male with HTN, smoker, failure to thrive, presented to the hospital with complaints of cough and shortness of breath. Found to have right lower lobe pneumonia as well as possible parapneumonic effusion. Pulmonary consulted. Underwent IR guided chest tube placement on 7/25.   Assessment & Plan      Right lower lobe pneumonia.  Multifocal pneumonia, Right pleural effusion with loculation. Possible lung mass. Post chest tube insertion pneumothorax -Pulmonology following, possible PNA versus malignancy  - Underwent IR guided chest tube insertion on 7/25. - LDH elevated suggesting exudative fluid.  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 -Pleural lytic therapy done by pulmonary on 7/26. -Bronchoscopy completed 7/30, CXR 7/31 with small to moderate PTX - pulmonology following, plan for CT chest today.  Not a surgical candidate due to severe debility and cachexia. -Biopsy pending, cytology negative    Dysphagia. SLP evaluation, MBS completed, recommended dysphagia 2 diet, thin liquids -Tolerating diet without difficulty   Acute metabolic encephalopathy. Likely in the setting of poor p.o. intake as well as infection. Mentation improving.  No focal deficit.    Former smoker. Quit 3 months ago.  Borderline BP with mild sinus tachycardia - Continue midodrine  5 mg 3 times daily  Severe protein calorie malnutrition, adult failure to thrive Nutrition Problem: Severe Malnutrition Etiology: chronic illness  (COPD, arthritis)  Signs/Symptoms: severe fat depletion, severe muscle depletion, energy intake < or equal to 75% for > or equal to 1 month, percent weight loss Percent weight loss: 23 % (in 7 months) Interventions: Ensure Enlive (each supplement provides 350kcal and 20 grams of protein), Magic cup, Liberalize Diet  Underweight (BMI < 18.5) Estimated body mass index is 13.2 kg/m as calculated from the following:   Height as of this encounter: 6' 3 (1.905 m).   Weight as of this encounter: 47.9 kg.  Code Status: Full code DVT Prophylaxis:  enoxaparin  (LOVENOX ) injection 40 mg Start: 08/16/23 1100   Level of Care: Level of care: Telemetry Medical Family Communication: Updated patient Disposition Plan:      Remains inpatient appropriate:      Procedures:    Consultants:     Antimicrobials:   Anti-infectives (From admission, onward)    Start     Dose/Rate Route Frequency Ordered Stop   08/17/23 1000  cefTRIAXone  (ROCEPHIN ) 1 g in sodium chloride  0.9 % 100 mL IVPB  Status:  Discontinued        1 g 200 mL/hr over 30 Minutes Intravenous Daily 08/16/23 1049 08/16/23 1100   08/17/23 1000  cefTRIAXone  (ROCEPHIN ) 2 g in sodium chloride  0.9 %  100 mL IVPB        2 g 200 mL/hr over 30 Minutes Intravenous Every 24 hours 08/16/23 1049 08/20/23 1345   08/17/23 1000  azithromycin  (ZITHROMAX ) tablet 500 mg        500 mg Oral Daily 08/16/23 1049 08/20/23 0804   08/16/23 1115  cefTRIAXone  (ROCEPHIN ) 1 g in sodium chloride  0.9 % 100 mL IVPB        1 g 200 mL/hr over 30 Minutes Intravenous  Once 08/16/23 1100 08/16/23 1214   08/16/23 0715  cefTRIAXone  (ROCEPHIN ) 1 g in sodium chloride  0.9 % 100 mL IVPB        1 g 200 mL/hr over 30 Minutes Intravenous  Once 08/16/23 0711 08/16/23 0824   08/16/23 0715  azithromycin  (ZITHROMAX ) 500 mg in sodium chloride  0.9 % 250 mL IVPB        500 mg 250 mL/hr over 60 Minutes Intravenous  Once 08/16/23 0711 08/16/23 0921          Medications   acetaminophen   1,000 mg Oral Q8H   enoxaparin  (LOVENOX ) injection  40 mg Subcutaneous Q24H   feeding supplement  237 mL Oral BID BM   guaiFENesin   600 mg Oral BID   midodrine   5 mg Oral TID WC   senna-docusate  1 tablet Oral BID   sodium chloride  flush  10 mL Intrapleural Q8H   sodium chloride  flush  3 mL Intravenous Q12H      Subjective:   Fred Wise was seen and examined today.  No acute complaints.  No acute chest pain, shortness of breath, fevers or chills, eating breakfast.   Objective:   Vitals:   08/23/23 1901 08/24/23 0309 08/24/23 0512 08/24/23 0736  BP: 119/82 (!) 109/52 118/83 (!) 101/58  Pulse: (!) 103 (!) 113 (!) 116 (!) 110  Resp: 18 18 18 17   Temp: 97.6 F (36.4 C)  98.5 F (36.9 C) 97.6 F (36.4 C)  TempSrc: Oral  Oral   SpO2: 96% 94% 95% 93%  Weight:      Height:        Intake/Output Summary (Last 24 hours) at 08/24/2023 1116 Last data filed at 08/24/2023 0900 Gross per 24 hour  Intake 370 ml  Output 975 ml  Net -605 ml     Wt Readings from Last 3 Encounters:  08/20/23 47.9 kg  10/31/21 58.5 kg  10/20/21 58.5 kg   Physical Exam General: Alert and oriented x 3, NAD, cachectic, ill-appearing Cardiovascular: S1 S2 clear, RRR.  Respiratory: Diminished breath sounds bilaterally, chest tube+ Gastrointestinal: Soft, nontender, nondistended, NBS Ext: no pedal edema bilaterally Neuro: no new deficits Psych: Normal affect       Data Reviewed:  I have personally reviewed following labs    CBC Lab Results  Component Value Date   WBC 9.2 08/23/2023   RBC 4.38 08/23/2023   HGB 12.2 (L) 08/23/2023   HCT 37.6 (L) 08/23/2023   MCV 85.8 08/23/2023   MCH 27.9 08/23/2023   PLT 523 (H) 08/23/2023   MCHC 32.4 08/23/2023   RDW 15.9 (H) 08/23/2023   LYMPHSABS 1.2 08/16/2023   MONOABS 0.9 08/16/2023   EOSABS 0.4 08/16/2023   BASOSABS 0.1 08/16/2023     Last metabolic panel Lab Results  Component Value Date   NA 134 (L) 08/23/2023   K 5.0  08/23/2023   CL 99 08/23/2023   CO2 29 08/23/2023   BUN 13 08/23/2023   CREATININE 0.74 08/23/2023   GLUCOSE 104 (H)  08/23/2023   GFRNONAA >60 08/23/2023   GFRAA >60 02/16/2018   CALCIUM 8.9 08/23/2023   PHOS 2.7 08/19/2023   PROT 6.0 (L) 08/16/2023   ALBUMIN 2.1 (L) 08/16/2023   BILITOT 1.3 (H) 08/16/2023   ALKPHOS 58 08/16/2023   AST 22 08/16/2023   ALT 14 08/16/2023   ANIONGAP 6 08/23/2023    CBG (last 3)  No results for input(s): GLUCAP in the last 72 hours.    Coagulation Profile: No results for input(s): INR, PROTIME in the last 168 hours.    Radiology Studies: I have personally reviewed the imaging studies  DG CHEST PORT 1 VIEW Result Date: 08/23/2023 CLINICAL DATA:  68 year old male with abnormal right lung. EXAM: PORTABLE CHEST 1 VIEW COMPARISON:  Chest CT 08/16/2023. FINDINGS: Portable AP upright view at 0815 hours. Ongoing dense plea opacified right mid and lower lung. Right pleural drain remains in place, new from prior CTA and stable since yesterday. Right lung ventilation only modestly improved compared to CTA chest scout view 08/16/2023. Right pneumothorax is small to moderate, stable since yesterday and probably related to poor expansion of the abnormal right lung. Stable contralateral left lung with emphysema and less pronounced left lower lobe airspace opacity. Paucity of bowel gas.  Stable visualized osseous structures. IMPRESSION: 1. Stable since yesterday. Right pleural drain with small to moderate pneumothorax likely due to poor re-expansion of abnormal right lung. Right lung ventilation only modestly improved from abnormal Chest CTA 08/16/2023 (please see that report). 2. Stable left lower lobe pneumonia. 3. No new cardiopulmonary abnormality. Electronically Signed   By: VEAR Hurst M.D.   On: 08/23/2023 10:41   DG Chest Port 1 View Result Date: 08/22/2023 CLINICAL DATA:  Status post bronchoscopy EXAM: PORTABLE CHEST 1 VIEW COMPARISON:  X-ray 08/22/2023.  FINDINGS: Extensive opacity of throughout the mid to lower right hemithorax with some volume loss and shift of the mediastinum from left-to-right. There is a pigtail catheter along the right lung base with a small right apical pneumothorax, similar to previous. Component pleural effusion is possible. Persistent interstitial and parenchymal opacity in left mid lower lung, slightly improved from previous. No left-sided pneumothorax or effusion. Normal cardiopericardial silhouette. Overlapping cardiac leads. IMPRESSION: Slight decrease in opacity at the left lung base with significant residual. Persistent diffuse right lung opacity with pneumothorax and pleural effusion as well as a pigtail catheter. Volume loss. Recommend follow-up. Electronically Signed   By: Ranell Bring M.D.   On: 08/22/2023 13:19   DG C-Arm 1-60 Min-No Report Result Date: 08/22/2023 Fluoroscopy was utilized by the requesting physician.  No radiographic interpretation.       Nydia Distance M.D. [facility] 08/24/2023, 11:16 AM  Available via Epic secure chat 7am-7pm After 7 pm, please refer to night coverage provider listed on amion.

## 2023-08-25 DIAGNOSIS — J939 Pneumothorax, unspecified: Secondary | ICD-10-CM | POA: Diagnosis not present

## 2023-08-25 DIAGNOSIS — R1310 Dysphagia, unspecified: Secondary | ICD-10-CM | POA: Diagnosis not present

## 2023-08-25 DIAGNOSIS — D72823 Leukemoid reaction: Secondary | ICD-10-CM

## 2023-08-25 DIAGNOSIS — J189 Pneumonia, unspecified organism: Secondary | ICD-10-CM | POA: Diagnosis not present

## 2023-08-25 DIAGNOSIS — J90 Pleural effusion, not elsewhere classified: Secondary | ICD-10-CM | POA: Diagnosis not present

## 2023-08-25 DIAGNOSIS — J449 Chronic obstructive pulmonary disease, unspecified: Secondary | ICD-10-CM | POA: Diagnosis not present

## 2023-08-25 NOTE — Progress Notes (Signed)
 NAME:  Fred Wise, MRN:  [id number], DOB:  1955-08-07, LOS: 9 ADMISSION DATE:  08/16/2023, CONSULTATION DATE: 7/24 REFERRING MD: MD Claudene with [facility] CHIEF COMPLAINT: Shortness of breath  History of Present Illness:  Pt is a 68 yr old male with pmhx significant of COPD, HTN, arthritis, and tobacco abuse (quit smoking 3 months ago) presents by EMS from home to [facility] emergency department for shortness of breath, which has been going on for about over a week per her report. Initial workup in the emergency department patient was given Solu-Medrol  125 mg IV and O2 saturations were low 90s on room air, send not needing oxygen  at this standpoint.  Noted to patient to have tachypnea, tachycardia, and blood pressure mainly normotensive to slightly hypertensive.  Initial lab work revealed sodium of 136, potassium of 4.5, creatinine 0.75, BUN 16, WBC of 11.7, hemoglobin 13.6, BNP 69, troponin high-sensitivity 11, procalcitonin 0.57  Chest x-ray-indicated dense consolidation of the right lung along with right pleural effusion CT angio also completed-negative for PE, but concern for multilobar pneumonia, moderate side pleural effusion and and scattered nodules within the left lung-noted to be complex/extensive recommendations by radiologist for repeat imaging within 4 weeks.   PCCM consulted for possible bronc/Thora in regards to CT scan findings.  Upon assessing patient, history was difficult to obtain from patient due to orientation being off.  Patient could tell us  that he was in the hospital but not but not and not really why that he was in the hospital.  Most history was obtained by brother at bedside along with family friend.  Per patient's brother, patient started having increased phlegm over the last week which was thick and white in consistency.  Also brother endorsed that patient has been coughing more often as well as had significant weight loss since Christmas but unable to tell amount of weight  lost.  Brother believes that patient has quit smoking over the last 2 to 3 months.  Patient's brother and family, could only tell me that patient may have been smoking for at least 1 pack a day for 30 to 40 years.  Brother and family member denies patient having chills, fever, any sick contacts, and or significant bleeding.  Brother also stated that patient did drive to doctor's appointment this past Tuesday and fell in the parking lot.  Not able to determine if this was witnessed or not.  In regards to the mentation/lack of orientation, patient's brother stated that mentation has been getting worse over the last couple months  Pertinent  Medical History   Past Medical History:  Diagnosis Date   Arthritis    knees, uses wheelchair   Hypertension      Significant Hospital Events: Including procedures, antibiotic start and stop dates in addition to other pertinent events   7/24: pccm consult for loculated effusion  7/25:  IR pigtail CT placement >> serosanguinous fluid 7/26 >> About 1 L serosanguineous fluid, lytics 7/30 bronchoscopy >> narrowing of right lower lobe bronchus, could not be traversed, brushings BAL transbronchial biopsy   Interim History / Subjective:   No overnight issues. <200 out of chest tube.   Objective    Blood pressure 104/77, pulse (!) 115, temperature 98.1 F (36.7 C), resp. rate 19, height 6' 3 (1.905 m), weight 47.9 kg, SpO2 94%.        Intake/Output Summary (Last 24 hours) at 08/25/2023 1237 Last data filed at 08/25/2023 1054 Gross per 24 hour  Intake 260 ml  Output 1115 ml  Net -855 ml   Filed Weights   08/16/23 1647 08/17/23 1152 08/20/23 1337  Weight: 44 kg 44 kg 47.9 kg    Examination: Thin, frail elderly man Nasal cannula Breath sounds diminished bilaterally Right sided chest tube in place, no air leak minimal serous drainage Extremities thin no edema Tachycardic, regular  Labs - pleural fluid shows exudative effusion CT Chest reviewed  shows right hydropneumothorax.  Cytology and pathology still pending  Resolved problem list   Assessment and Plan  Multilobar pneumonia/CAP Concern for malignancy Weight loss - unknown over months  Right parapneumonic effusion status post pigtail >>hydropneumothorax Pleural fluid exudative by LDH, low protein, lymphocytic COPD, recent tobacco use cessation Dysphagia CT Chest shows extensive bilateral lower lobe airspace disease R>L> bilious secretions noted during bronchoscopy. I am mostly concerned for malignancy vs aspiration given his ongoing dysphagia. If malignancy work up is negative he'll need prolonged abx course with augmentin  for aspiration and follow up ct scan with office appt.  - Pleural fluid cytology negative x 1 - con't mucinex , duonebs,  - flutter valve - I think chest tube is not doing much - will take it out and repeat chest xray in am to ensure no worsening of the pneumothorax.   Other Hospital problems managed by primary Altered mental status -resolved, CT head  negative.  Hypertension Arthritis Tobacco abuse  I updated the patient and his brother on plan of care.   Verdon Gore, MD Pulmonary and Critical Care Medicine [facility] 08/25/2023 12:37 PM Pager: see AMION  If no response to pager, please call critical care on call (see AMION) until 7pm After 7:00 pm call Elink

## 2023-08-25 NOTE — Plan of Care (Signed)
  Problem: Education: Goal: Knowledge of General Education information will improve Description: Including pain rating scale, medication(s)/side effects and non-pharmacologic comfort measures Outcome: Progressing   Problem: Clinical Measurements: Goal: Will remain free from infection Outcome: Progressing Goal: Cardiovascular complication will be avoided Outcome: Progressing   Problem: Nutrition: Goal: Adequate nutrition will be maintained Outcome: Progressing   

## 2023-08-25 NOTE — Progress Notes (Signed)
 [facility]                                                                              Fred Wise, is a 68 y.o. male, DOB - 1955-10-31, FMW:[id number] Admit date - 08/16/2023    Outpatient Primary MD for the patient is [name], [name], MD  LOS - 9  days  Chief Complaint  Patient presents with   Shortness of Breath       Brief summary   Patient is a 68 year old male with HTN, smoker, failure to thrive, presented to the hospital with complaints of cough and shortness of breath. Found to have right lower lobe pneumonia as well as possible parapneumonic effusion. Pulmonary consulted. Underwent IR guided chest tube placement on 7/25.   Assessment & Plan      Right lower lobe pneumonia.  Multifocal pneumonia, Right pleural effusion with loculation. Possible lung mass. Post chest tube insertion pneumothorax -Pulmonology following, possible PNA versus malignancy  - Underwent IR guided chest tube insertion on 7/25. - LDH elevated suggesting exudative fluid.  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 -Pleural lytic therapy done by pulmonary on 7/26. -Bronchoscopy completed 7/30, CXR 7/31 with small to moderate PTX -chest tube placed. pulmonology following,, not a surgical candidate due to severe debility and cachexia. -Biopsy pending, cytology negative  - CT chest shows right sided moderate hydropneumothorax, Persistent consolidations involving the right middle and lower lobe and to lesser extent left lower lobe with severe volume loss and rightward mediastinal shift. Underlying malignancy cannot be excluded. Innumerable pulmonary nodules.    Dysphagia. SLP evaluation, MBS completed, recommended dysphagia 2 diet, thin liquids -Tolerating diet without difficulty   Acute metabolic encephalopathy. Likely in the setting of poor p.o. intake as well as infection. Mentation  improving.  No focal deficit.    Former smoker. Quit 3 months ago.  Borderline BP with mild sinus tachycardia - Continue midodrine  5 mg 3 times daily  Severe protein calorie malnutrition, adult failure to thrive Nutrition Problem: Severe Malnutrition Etiology: chronic illness (COPD, arthritis)  Signs/Symptoms: severe fat depletion, severe muscle depletion, energy intake < or equal to 75% for > or equal to 1 month, percent weight loss Percent weight loss: 23 % (in 7 months) Interventions: Ensure Enlive (each supplement provides 350kcal and 20 grams of protein), Magic cup, Liberalize Diet  Underweight (BMI < 18.5) Estimated body mass index is 13.2 kg/m as calculated from the following:   Height as of this encounter: 6' 3 (1.905 m).   Weight as of this encounter: 47.9 kg.  Code Status: Full code DVT Prophylaxis:  enoxaparin  (LOVENOX ) injection 40 mg Start: 08/16/23 1100   Level of Care: Level of care: Telemetry Medical Family Communication: Updated patient Disposition Plan:      Remains inpatient appropriate:      Procedures:    Consultants:     Antimicrobials:   Anti-infectives (From admission, onward)    Start     Dose/Rate Route Frequency Ordered Stop   08/17/23 1000  cefTRIAXone  (ROCEPHIN ) 1 g in sodium chloride  0.9 % 100 mL IVPB  Status:  Discontinued        1 g 200 mL/hr over 30 Minutes Intravenous Daily 08/16/23 1049 08/16/23 1100   08/17/23 1000  cefTRIAXone  (ROCEPHIN ) 2 g in sodium chloride  0.9 % 100 mL IVPB        2 g 200 mL/hr over 30 Minutes Intravenous Every 24 hours 08/16/23 1049 08/20/23 1345   08/17/23 1000  azithromycin  (ZITHROMAX ) tablet 500 mg        500 mg Oral Daily 08/16/23 1049 08/20/23 0804   08/16/23 1115  cefTRIAXone  (ROCEPHIN ) 1 g in sodium chloride  0.9 % 100 mL IVPB        1 g 200 mL/hr over 30 Minutes Intravenous  Once 08/16/23 1100 08/16/23 1214   08/16/23 0715  cefTRIAXone  (ROCEPHIN ) 1 g in sodium chloride  0.9 % 100 mL IVPB        1  g 200 mL/hr over 30 Minutes Intravenous  Once 08/16/23 0711 08/16/23 0824   08/16/23 0715  azithromycin  (ZITHROMAX ) 500 mg in sodium chloride  0.9 % 250 mL IVPB        500 mg 250 mL/hr over 60 Minutes Intravenous  Once 08/16/23 0711 08/16/23 0921          Medications  acetaminophen   1,000 mg Oral Q8H   enoxaparin  (LOVENOX ) injection  40 mg Subcutaneous Q24H   feeding supplement  237 mL Oral BID BM   guaiFENesin   600 mg Oral BID   midodrine   5 mg Oral TID WC   senna-docusate  1 tablet Oral BID   sodium chloride  flush  10 mL Intrapleural Q8H   sodium chloride  flush  3 mL Intravenous Q12H      Subjective:   [name] was seen and examined today.  No acute complaints today.   Objective:   Vitals:   08/24/23 1418 08/24/23 1942 08/25/23 0659 08/25/23 0729  BP: 123/70 122/68 118/76 104/77  Pulse:  (!) 113 (!) 118 (!) 115  Resp: 17 18 19    Temp: 97.8 F (36.6 C) 97.6 F (36.4 C) 99 F (37.2 C) 98.1 F (36.7 C)  TempSrc:  Oral Oral   SpO2: 94% 93% 94% 94%  Weight:      Height:        Intake/Output Summary (Last 24 hours) at 08/25/2023 1012 Last data filed at 08/25/2023 0709 Gross per 24 hour  Intake 260 ml  Output 965 ml  Net -705 ml     Wt Readings from Last 3 Encounters:  08/20/23 47.9 kg  10/31/21 58.5 kg  10/20/21 58.5 kg   Physical Exam General: Alert and oriented x 3, NAD, cachetic  Cardiovascular: S1 S2 clear, RRR.  Respiratory: diminished BS at bases  Gastrointestinal: Soft, nontender, nondistended, NBS Ext: no pedal edema bilaterally Neuro: no new deficits Psych: Normal affect        Data Reviewed:  I have personally reviewed following labs    CBC Lab Results  Component Value Date   WBC 9.2 08/23/2023   RBC 4.38 08/23/2023   HGB 12.2 (L) 08/23/2023   HCT 37.6 (L) 08/23/2023   MCV 85.8 08/23/2023   MCH 27.9 08/23/2023   PLT 523 (H) 08/23/2023   MCHC 32.4 08/23/2023   RDW 15.9 (H) 08/23/2023   LYMPHSABS 1.2 08/16/2023   MONOABS  0.9 08/16/2023   EOSABS 0.4 08/16/2023   BASOSABS 0.1 08/16/2023     Last metabolic panel Lab Results  Component Value Date   NA 134 (L) 08/23/2023   K 5.0 08/23/2023  CL 99 08/23/2023   CO2 29 08/23/2023   BUN 13 08/23/2023   CREATININE 0.74 08/23/2023   GLUCOSE 104 (H) 08/23/2023   GFRNONAA >60 08/23/2023   GFRAA >60 02/16/2018   CALCIUM 8.9 08/23/2023   PHOS 2.7 08/19/2023   PROT 6.0 (L) 08/16/2023   ALBUMIN 2.1 (L) 08/16/2023   BILITOT 1.3 (H) 08/16/2023   ALKPHOS 58 08/16/2023   AST 22 08/16/2023   ALT 14 08/16/2023   ANIONGAP 6 08/23/2023    CBG (last 3)  No results for input(s): GLUCAP in the last 72 hours.    Coagulation Profile: No results for input(s): INR, PROTIME in the last 168 hours.    Radiology Studies: I have personally reviewed the imaging studies  CT CHEST WO CONTRAST Result Date: 08/24/2023 CLINICAL DATA:  Pneumonia EXAM: CT CHEST WITHOUT CONTRAST TECHNIQUE: Multidetector CT imaging of the chest was performed following the standard protocol without IV contrast. RADIATION DOSE REDUCTION: This exam was performed according to the departmental dose-optimization program which includes automated exposure control, adjustment of the mA and/or kV according to patient size and/or use of iterative reconstruction technique. COMPARISON:  Chest radiograph August 23, 2023, chest CT August 16, 2023 FINDINGS: Cardiovascular: Cardiomediastinal shift to the right due to volume loss. Mediastinum/Nodes: Limited evaluation due mediastinal shift. Prominent lymph nodes for example in the left lower paratracheal up to 1.2 cm, similar to prior. Patulous esophagus containing retained secretions in lower part. Lungs/Pleura: Right-sided interval development of right-sided moderate hydropneumothorax, right apical intrapleural distance up to 1.5 cm. Right pleural catheter tip in anterior pleural cavity. Persistent consolidations involving bilateral lower lobes right greater than left  with associated bronchiectasis, and also right middle lobe with near complete occlusion of the bronchus intermedius and partial occlusion of the right upper lobe bronchus. There are innumerable bronchocentric solid and cavitary pulmonary nodules throughout both lungs measuring up to 8 mm, grossly stable to prior for example left upper lobe subpleural nodule measuring 8 mm image 4/82. Multiple foci of mucostasis. Diffuse bronchial and bronchiolar wall thickening. Moderate to severe centrilobular emphysematous changes. Small left-sided pleural effusion. Frothy secretions are identified in right main bronchus. Trachea is patent. Small right tracheal diverticulum in right posterolateral measuring 7 mm. Upper Abdomen: Multiple hypodense liver lesions, incompletely assessed on current noncontrast nondedicated CT. Musculoskeletal: Multilevel degenerative changes of the spine. IMPRESSION: Right sided moderate hydropneumothorax with the right and needs to have an pleural catheter in place. Trace pleural effusion on the left. Persistent consolidations involving the right middle and lower lobe and to lesser extent left lower lobe with severe volume loss and rightward mediastinal shift. Underlying malignancy cannot be excluded. Recommend follow-up to ensure resolution. Innumerable pulmonary nodules up to 8 mm, grossly stable and considered indeterminate. Metastasis cannot be excluded. Differentials include infection/inflammation (for example coccidiomycosis). Correlate with clinical findings and follow-up to ensure resolution. Centrilobular emphysematous changes. Multiple liver hypodense lesions incompletely assessed, likely representing cysts. Correlate with dedicated liver imaging. Electronically Signed   By: Megan  Zare M.D.   On: 08/24/2023 15:48       Swade Shonka M.D. [facility] 08/25/2023, 10:12 AM  Available via Epic secure chat 7am-7pm After 7 pm, please refer to night coverage provider listed on  amion.

## 2023-08-26 ENCOUNTER — Inpatient Hospital Stay (HOSPITAL_COMMUNITY)

## 2023-08-26 DIAGNOSIS — J939 Pneumothorax, unspecified: Secondary | ICD-10-CM | POA: Diagnosis not present

## 2023-08-26 DIAGNOSIS — J449 Chronic obstructive pulmonary disease, unspecified: Secondary | ICD-10-CM | POA: Diagnosis not present

## 2023-08-26 DIAGNOSIS — J90 Pleural effusion, not elsewhere classified: Secondary | ICD-10-CM | POA: Diagnosis not present

## 2023-08-26 DIAGNOSIS — R1310 Dysphagia, unspecified: Secondary | ICD-10-CM | POA: Diagnosis not present

## 2023-08-26 DIAGNOSIS — G9340 Encephalopathy, unspecified: Secondary | ICD-10-CM | POA: Diagnosis not present

## 2023-08-26 DIAGNOSIS — R634 Abnormal weight loss: Secondary | ICD-10-CM | POA: Diagnosis not present

## 2023-08-26 DIAGNOSIS — J189 Pneumonia, unspecified organism: Secondary | ICD-10-CM | POA: Diagnosis not present

## 2023-08-26 LAB — BASIC METABOLIC PANEL WITH GFR
Anion gap: 13 (ref 5–15)
BUN: 15 mg/dL (ref 8–23)
CO2: 22 mmol/L (ref 22–32)
Calcium: 9.1 mg/dL (ref 8.9–10.3)
Chloride: 95 mmol/L — ABNORMAL LOW (ref 98–111)
Creatinine, Ser: 0.63 mg/dL (ref 0.61–1.24)
GFR, Estimated: >60 mL/min (ref >60)
Glucose, Bld: 91 mg/dL (ref 70–99)
Potassium: 4.4 mmol/L (ref 3.5–5.1)
Sodium: 130 mmol/L — ABNORMAL LOW (ref 135–145)

## 2023-08-26 LAB — CBC
HCT: 38 % — ABNORMAL LOW (ref 39.0–52.0)
Hemoglobin: 12.5 g/dL — ABNORMAL LOW (ref 13.0–17.0)
MCH: 28 pg (ref 26.0–34.0)
MCHC: 32.9 g/dL (ref 30.0–36.0)
MCV: 85 fL (ref 80.0–100.0)
Platelets: 638 K/uL — ABNORMAL HIGH (ref 150–400)
RBC: 4.47 MIL/uL (ref 4.22–5.81)
RDW: 15.9 % — ABNORMAL HIGH (ref 11.5–15.5)
WBC: 10.9 K/uL — ABNORMAL HIGH (ref 4.0–10.5)
nRBC: 0 % (ref 0.0–0.2)

## 2023-08-26 NOTE — Progress Notes (Signed)
 Received in report that IR was to remove the chest tube and they were informed by day shift nurse. IR called twice by this RN  but no answer. We will pass it on to day shift to follow up on it.

## 2023-08-26 NOTE — Progress Notes (Addendum)
 NAME:  Fred Wise, MRN:  [id number], DOB:  [birth year], LOS: 10 ADMISSION DATE:  08/16/2023, CONSULTATION DATE: 7/24 REFERRING MD: MD Claudene with [facility] CHIEF COMPLAINT: Shortness of breath  History of Present Illness:  Pt is a 68 yr old male with pmhx significant of COPD, HTN, arthritis, and tobacco abuse (quit smoking 3 months ago) presents by EMS from home to [facility] emergency department for shortness of breath, which has been going on for about over a week per her report. Initial workup in the emergency department patient was given Solu-Medrol  125 mg IV and O2 saturations were low 90s on room air, send not needing oxygen  at this standpoint.  Noted to patient to have tachypnea, tachycardia, and blood pressure mainly normotensive to slightly hypertensive.  Initial lab work revealed sodium of 136, potassium of 4.5, creatinine 0.75, BUN 16, WBC of 11.7, hemoglobin 13.6, BNP 69, troponin high-sensitivity 11, procalcitonin 0.57  Chest x-ray-indicated dense consolidation of the right lung along with right pleural effusion CT angio also completed-negative for PE, but concern for multilobar pneumonia, moderate side pleural effusion and and scattered nodules within the left lung-noted to be complex/extensive recommendations by radiologist for repeat imaging within 4 weeks.   PCCM consulted for possible bronc/Thora in regards to CT scan findings.  Upon assessing patient, history was difficult to obtain from patient due to orientation being off.  Patient could tell us  that he was in the hospital but not but not and not really why that he was in the hospital.  Most history was obtained by brother at bedside along with family friend.  Per patient's brother, patient started having increased phlegm over the last week which was thick and white in consistency.  Also brother endorsed that patient has been coughing more often as well as had significant weight loss since Christmas but unable to tell amount of weight  lost.  Brother believes that patient has quit smoking over the last 2 to 3 months.  Patient's brother and family, could only tell me that patient may have been smoking for at least 1 pack a day for 30 to 40 years.  Brother and family member denies patient having chills, fever, any sick contacts, and or significant bleeding.  Brother also stated that patient did drive to doctor's appointment this past Tuesday and fell in the parking lot.  Not able to determine if this was witnessed or not.  In regards to the mentation/lack of orientation, patient's brother stated that mentation has been getting worse over the last couple months  Pertinent  Medical History   Past Medical History:  Diagnosis Date   Arthritis    knees, uses wheelchair   Hypertension      Significant Hospital Events: Including procedures, antibiotic start and stop dates in addition to other pertinent events   7/24: pccm consult for loculated effusion  7/25:  IR pigtail CT placement >> serosanguinous fluid 7/26 >> About 1 L serosanguineous fluid, lytics 7/30 bronchoscopy >> narrowing of right lower lobe bronchus, could not be traversed, brushings BAL transbronchial biopsy   Interim History / Subjective:  No overnight issues.  <30cc out in 24/hrs Objective    Blood pressure 116/78, pulse (!) 121, temperature 97.9 F (36.6 C), temperature source Oral, resp. rate 18, height 6' 3 (1.905 m), weight 47.9 kg, SpO2 91%.        Intake/Output Summary (Last 24 hours) at 08/26/2023 1455 Last data filed at 08/26/2023 0448 Gross per 24 hour  Intake --  Output  500 ml  Net -500 ml   Filed Weights   08/16/23 1647 08/17/23 1152 08/20/23 1337  Weight: 44 kg 44 kg 47.9 kg    Examination: Thin frail elderly man, no distress  on nasal cannula Breath sounds diminished bilateral bases.  No wheeze Extremities thin, no edema Chest tube in place, minimal serous drainage, no air leak  Labs - pleural fluid shows exudative effusion CT  Chest reviewed shows right hydropneumothorax.  Cytology and pathology still pending  Resolved problem list   Assessment and Plan  Multilobar pneumonia/CAP Concern for malignancy Weight loss - unknown over months  Right parapneumonic effusion status post pigtail >>hydropneumothorax Pleural fluid exudative by LDH, low protein, lymphocytic COPD, recent tobacco use cessation Dysphagia CT Chest shows extensive bilateral lower lobe airspace disease R>L> bilious secretions noted during bronchoscopy. I am mostly concerned for malignancy vs aspiration given his ongoing dysphagia. If malignancy work up is negative he'll need prolonged abx course with augmentin  for aspiration and follow up ct scan with office appt.  - Pleural fluid cytology negative x 1 - con't mucinex , duonebs,  - flutter valve -ordered placed for IR to d/c tube Monday. - chest xray in am  Other Hospital problems managed by primary Altered mental status -resolved, CT head  negative.  Hypertension Arthritis Tobacco abuse  Pccm will follow tomorrow.   Verdon Gore, MD Pulmonary and Critical Care Medicine [facility] 08/26/2023 3:06 PM Pager: see AMION  If no response to pager, please call critical care on call (see AMION) until 7pm After 7:00 pm call Elink

## 2023-08-26 NOTE — Plan of Care (Signed)

## 2023-08-26 NOTE — Plan of Care (Signed)
  Problem: Clinical Measurements: Goal: Will remain free from infection Outcome: Progressing   Problem: Nutrition: Goal: Adequate nutrition will be maintained Outcome: Progressing   Problem: Coping: Goal: Level of anxiety will decrease Outcome: Progressing   Problem: Elimination: Goal: Will not experience complications related to urinary retention Outcome: Progressing

## 2023-08-26 NOTE — Progress Notes (Signed)
 [facility]                                                                              Fred Wise, is a 68 y.o. male, DOB - 1955-04-29, FMW:[id number] Admit date - 08/16/2023    Outpatient Primary MD for the patient is [name], [name], MD  LOS - 10  days  Chief Complaint  Patient presents with   Shortness of Breath       Brief summary   Patient is a 68 year old male with HTN, smoker, failure to thrive, presented to the hospital with complaints of cough and shortness of breath. Found to have right lower lobe pneumonia as well as possible parapneumonic effusion. Pulmonary consulted. Underwent IR guided chest tube placement on 7/25.   Assessment & Plan      Right lower lobe pneumonia.  Multifocal pneumonia, Right pleural effusion with loculation. Possible lung mass. Post chest tube insertion pneumothorax -Pulmonology following, possible PNA versus malignancy  - Underwent IR guided chest tube insertion on 7/25. - LDH elevated suggesting exudative fluid.  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 -Pleural lytic therapy done by pulmonary on 7/26. -Bronchoscopy completed 7/30, CXR 7/31 with small to moderate PTX -chest tube placed. pulmonology following,, not a surgical candidate due to severe debility and cachexia. -Biopsy pending, cytology negative  - CT chest shows right sided moderate hydropneumothorax, Persistent consolidations involving the right middle and lower lobe and to lesser extent left lower lobe with severe volume loss and rightward mediastinal shift. Underlying malignancy cannot be excluded. Innumerable pulmonary nodules.  - CXR today showed resolved right-sided pneumothorax, awaiting pulmonology    Dysphagia. SLP evaluation, MBS completed, recommended dysphagia 2 diet, thin liquids -Tolerating diet without difficulty   Acute metabolic  encephalopathy. Likely in the setting of poor p.o. intake as well as infection. Mentation improving.  No focal deficit.    Former smoker. Quit 3 months ago.  Borderline BP with mild sinus tachycardia - Continue midodrine  5 mg 3 times daily  Severe protein calorie malnutrition, adult failure to thrive Nutrition Problem: Severe Malnutrition Etiology: chronic illness (COPD, arthritis)  Signs/Symptoms: severe fat depletion, severe muscle depletion, energy intake < or equal to 75% for > or equal to 1 month, percent weight loss Percent weight loss: 23 % (in 7 months) Interventions: Ensure Enlive (each supplement provides 350kcal and 20 grams of protein), Magic cup, Liberalize Diet  Underweight (BMI < 18.5) Estimated body mass index is 13.2 kg/m as calculated from the following:   Height as of this encounter: 6' 3 (1.905 m).   Weight as of this encounter: 47.9 kg.  Code Status: Full code DVT Prophylaxis:  enoxaparin  (LOVENOX ) injection 40 mg Start: 08/16/23 1100   Level of Care: Level of care: Telemetry Medical Family Communication: Updated patient Disposition Plan:      Remains inpatient appropriate:   Once cleared by pulmonology   Procedures:    Consultants:   PCCM  Antimicrobials:   Anti-infectives (From admission, onward)    Start     Dose/Rate Route Frequency Ordered Stop   08/17/23 1000  cefTRIAXone  (ROCEPHIN ) 1 g in sodium chloride  0.9 % 100 mL IVPB  Status:  Discontinued        1 g 200 mL/hr over 30 Minutes Intravenous Daily 08/16/23 1049 08/16/23 1100   08/17/23 1000  cefTRIAXone  (ROCEPHIN ) 2 g in sodium chloride  0.9 % 100 mL IVPB        2 g 200 mL/hr over 30 Minutes Intravenous Every 24 hours 08/16/23 1049 08/20/23 1345   08/17/23 1000  azithromycin  (ZITHROMAX ) tablet 500 mg        500 mg Oral Daily 08/16/23 1049 08/20/23 0804   08/16/23 1115  cefTRIAXone  (ROCEPHIN ) 1 g in sodium chloride  0.9 % 100 mL IVPB        1 g 200 mL/hr over 30 Minutes Intravenous   Once 08/16/23 1100 08/16/23 1214   08/16/23 0715  cefTRIAXone  (ROCEPHIN ) 1 g in sodium chloride  0.9 % 100 mL IVPB        1 g 200 mL/hr over 30 Minutes Intravenous  Once 08/16/23 0711 08/16/23 0824   08/16/23 0715  azithromycin  (ZITHROMAX ) 500 mg in sodium chloride  0.9 % 250 mL IVPB        500 mg 250 mL/hr over 60 Minutes Intravenous  Once 08/16/23 0711 08/16/23 0921          Medications  acetaminophen   1,000 mg Oral Q8H   enoxaparin  (LOVENOX ) injection  40 mg Subcutaneous Q24H   feeding supplement  237 mL Oral BID BM   guaiFENesin   600 mg Oral BID   midodrine   5 mg Oral TID WC   senna-docusate  1 tablet Oral BID   sodium chloride  flush  10 mL Intrapleural Q8H   sodium chloride  flush  3 mL Intravenous Q12H      Subjective:   Fred Wise was seen and examined today.  No acute issues, still has a chest tube this morning.  Tolerating breakfast without difficulty.  Pleasant and cooperative, no nausea vomiting abdominal pain, acute chest pain fevers or chills.  Objective:   Vitals:   08/25/23 1459 08/25/23 2010 08/26/23 0454 08/26/23 0742  BP: 118/69 122/75 126/77 116/78  Pulse: (!) 114 (!) 106  (!) 121  Resp: 20 18 18    Temp: 98.1 F (36.7 C) 98 F (36.7 C) 98.1 F (36.7 C) 97.9 F (36.6 C)  TempSrc:  Oral Oral Oral  SpO2: 96% 96% 95% 91%  Weight:      Height:        Intake/Output Summary (Last 24 hours) at 08/26/2023 1105 Last data filed at 08/26/2023 0448 Gross per 24 hour  Intake --  Output 500 ml  Net -500 ml     Wt Readings from Last 3 Encounters:  08/20/23 47.9 kg  10/31/21 58.5 kg  10/20/21 58.5 kg   Physical Exam General: Alert and oriented x 3, NAD, cachectic Cardiovascular: S1 S2 clear, RRR.  Respiratory: Diminished breath sound at the bases Gastrointestinal: Soft, nontender, nondistended, NBS Ext: no pedal edema bilaterally Neuro: no new deficits Psych: Normal affect, pleasant     Data Reviewed:  I have personally reviewed following labs     CBC Lab Results  Component Value Date   WBC 10.9 (H) 08/26/2023   RBC 4.47 08/26/2023   HGB 12.5 (L) 08/26/2023   HCT 38.0 (L) 08/26/2023   MCV 85.0 08/26/2023   MCH 28.0 08/26/2023   PLT 638 (H) 08/26/2023   MCHC 32.9 08/26/2023   RDW 15.9 (H) 08/26/2023   LYMPHSABS 1.2 08/16/2023   MONOABS  0.9 08/16/2023   EOSABS 0.4 08/16/2023   BASOSABS 0.1 08/16/2023     Last metabolic panel Lab Results  Component Value Date   NA 130 (L) 08/26/2023   K 4.4 08/26/2023   CL 95 (L) 08/26/2023   CO2 22 08/26/2023   BUN 15 08/26/2023   CREATININE 0.63 08/26/2023   GLUCOSE 91 08/26/2023   GFRNONAA >60 08/26/2023   GFRAA >60 02/16/2018   CALCIUM 9.1 08/26/2023   PHOS 2.7 08/19/2023   PROT 6.0 (L) 08/16/2023   ALBUMIN 2.1 (L) 08/16/2023   BILITOT 1.3 (H) 08/16/2023   ALKPHOS 58 08/16/2023   AST 22 08/16/2023   ALT 14 08/16/2023   ANIONGAP 13 08/26/2023    CBG (last 3)  No results for input(s): GLUCAP in the last 72 hours.    Coagulation Profile: No results for input(s): INR, PROTIME in the last 168 hours.    Radiology Studies: I have personally reviewed the imaging studies  DG CHEST PORT 1 VIEW Result Date: 08/26/2023 EXAM: 1 VIEW XRAY OF THE CHEST 08/26/2023 06:25:00 AM COMPARISON: AP radiograph of the chest dated 08/23/2023. CLINICAL HISTORY: [id number] Pneumothorax. Reason for exam: pt order states pneumothorax. FINDINGS: LUNGS AND PLEURA: Since the previous study, a right-sided pneumothorax has resolved. There is persistent hazy, streaky opacification of the right lung and elevation of the right hemidiaphragm. There is also right lateral pleural effusion/pleural thickening. There are coarse interstitial opacities present throughout the left mid and lower lung zones. HEART AND MEDIASTINUM: The heart is normal in size. BONES AND SOFT TISSUES: No acute osseous abnormality. IMPRESSION: 1. Resolved right-sided pneumothorax. 2. Persistent hazy, streaky opacification of the right  lung, elevation of the right hemidiaphragm, and right lateral pleural effusion/thickening. 3. Coarse interstitial opacities in the left mid and lower lung zones. Electronically signed by: evalene coho 08/26/2023 08:05 AM EDT RP Workstation: [id number]   CT CHEST WO CONTRAST Result Date: 08/24/2023 CLINICAL DATA:  Pneumonia EXAM: CT CHEST WITHOUT CONTRAST TECHNIQUE: Multidetector CT imaging of the chest was performed following the standard protocol without IV contrast. RADIATION DOSE REDUCTION: This exam was performed according to the departmental dose-optimization program which includes automated exposure control, adjustment of the mA and/or kV according to patient size and/or use of iterative reconstruction technique. COMPARISON:  Chest radiograph August 23, 2023, chest CT August 16, 2023 FINDINGS: Cardiovascular: Cardiomediastinal shift to the right due to volume loss. Mediastinum/Nodes: Limited evaluation due mediastinal shift. Prominent lymph nodes for example in the left lower paratracheal up to 1.2 cm, similar to prior. Patulous esophagus containing retained secretions in lower part. Lungs/Pleura: Right-sided interval development of right-sided moderate hydropneumothorax, right apical intrapleural distance up to 1.5 cm. Right pleural catheter tip in anterior pleural cavity. Persistent consolidations involving bilateral lower lobes right greater than left with associated bronchiectasis, and also right middle lobe with near complete occlusion of the bronchus intermedius and partial occlusion of the right upper lobe bronchus. There are innumerable bronchocentric solid and cavitary pulmonary nodules throughout both lungs measuring up to 8 mm, grossly stable to prior for example left upper lobe subpleural nodule measuring 8 mm image 4/82. Multiple foci of mucostasis. Diffuse bronchial and bronchiolar wall thickening. Moderate to severe centrilobular emphysematous changes. Small left-sided pleural effusion. Frothy  secretions are identified in right main bronchus. Trachea is patent. Small right tracheal diverticulum in right posterolateral measuring 7 mm. Upper Abdomen: Multiple hypodense liver lesions, incompletely assessed on current noncontrast nondedicated CT. Musculoskeletal: Multilevel degenerative changes of the spine. IMPRESSION: Right sided moderate  hydropneumothorax with the right and needs to have an pleural catheter in place. Trace pleural effusion on the left. Persistent consolidations involving the right middle and lower lobe and to lesser extent left lower lobe with severe volume loss and rightward mediastinal shift. Underlying malignancy cannot be excluded. Recommend follow-up to ensure resolution. Innumerable pulmonary nodules up to 8 mm, grossly stable and considered indeterminate. Metastasis cannot be excluded. Differentials include infection/inflammation (for example coccidiomycosis). Correlate with clinical findings and follow-up to ensure resolution. Centrilobular emphysematous changes. Multiple liver hypodense lesions incompletely assessed, likely representing cysts. Correlate with dedicated liver imaging. Electronically Signed   By: Megan  Zare M.D.   On: 08/24/2023 15:48       Vanellope Passmore M.D. [facility] 08/26/2023, 11:05 AM  Available via Epic secure chat 7am-7pm After 7 pm, please refer to night coverage provider listed on amion.

## 2023-08-27 ENCOUNTER — Inpatient Hospital Stay (HOSPITAL_COMMUNITY)

## 2023-08-27 ENCOUNTER — Telehealth: Payer: Self-pay | Admitting: Acute Care

## 2023-08-27 DIAGNOSIS — J90 Pleural effusion, not elsewhere classified: Secondary | ICD-10-CM | POA: Diagnosis not present

## 2023-08-27 DIAGNOSIS — G9340 Encephalopathy, unspecified: Secondary | ICD-10-CM | POA: Diagnosis not present

## 2023-08-27 DIAGNOSIS — J189 Pneumonia, unspecified organism: Secondary | ICD-10-CM | POA: Diagnosis not present

## 2023-08-27 LAB — ANAEROBIC CULTURE W GRAM STAIN

## 2023-08-27 LAB — OSMOLALITY: Osmolality: 283 mosm/kg (ref 275–295)

## 2023-08-27 MED ORDER — AMOXICILLIN-POT CLAVULANATE 875-125 MG PO TABS
1.0000 | ORAL_TABLET | Freq: Two times a day (BID) | ORAL | Status: DC
  Administered 2023-08-27 – 2023-08-28 (×2): 1 via ORAL
  Filled 2023-08-27 (×2): qty 1

## 2023-08-27 MED ORDER — IOHEXOL 350 MG/ML SOLN
75.0000 mL | Freq: Once | INTRAVENOUS | Status: AC | PRN
  Administered 2023-08-27: 75 mL via INTRAVENOUS

## 2023-08-27 NOTE — Progress Notes (Addendum)
 Referring Provider(s): Meade Daniels   Supervising Physician: Johann Sieving  Patient Status:  [facility] - In-pt  Chief Complaint:  Loculated right pleural effusion s/p right chest tube placement 08/17/23 Dr. Jenna   Subjective:  Patient is feeling well, sitting up in bed, NAD. He reports feeling pleased that he is eating better, shows me his breakfast plate. Denies fever, chill, SOB, pain. States he is excited for when he will get to go home.   Allergies: Patient has no known allergies.  Medications: Prior to Admission medications   Medication Sig Start Date End Date Taking? Authorizing Provider  albuterol  (PROVENTIL  HFA;VENTOLIN  HFA) 108 (90 Base) MCG/ACT inhaler Inhale 2 puffs into the lungs every 6 (six) hours as needed for wheezing or shortness of breath. 02/17/18  Yes Drusilla, Sabas RAMAN, MD  amLODipine  (NORVASC ) 2.5 MG tablet Take 2.5 mg by mouth daily.   Yes [provider]     Vital Signs: BP (!) 101/57 (BP Location: Right Arm)   Pulse (!) 115   Temp 97.6 F (36.4 C)   Resp 18   Ht 6' 3 (1.905 m)   Wt 105 lb 9.6 oz (47.9 kg) Comment: with blankets on bed  SpO2 94%   BMI 13.20 kg/m   Physical Exam Constitutional:      Appearance: Normal appearance.  Skin:    Comments: Right chest tube suture intact Skin clean, dry, without signs of infection   Neurological:     Mental Status: He is alert and oriented to person, place, and time.  Psychiatric:        Mood and Affect: Mood normal.        Behavior: Behavior normal.      Labs:  CBC: Recent Labs    08/21/23 0607 08/22/23 0553 08/23/23 0636 08/26/23 0646  WBC 10.5 10.3 9.2 10.9*  HGB 12.3* 13.0 12.2* 12.5*  HCT 35.8* 38.3* 37.6* 38.0*  PLT 421* 480* 523* 638*    COAGS: Recent Labs    08/17/23 0923  INR 1.1    BMP: Recent Labs    08/21/23 0607 08/22/23 0553 08/23/23 0636 08/26/23 0646  NA 131* 132* 134* 130*  K 3.9 4.3 5.0 4.4  CL 97* 99 99 95*  CO2 25 25 29 22   GLUCOSE 92 98  104* 91  BUN 18 11 13 15   CALCIUM 8.7* 8.9 8.9 9.1  CREATININE 0.54* 0.51* 0.74 0.63  GFRNONAA >60 >60 >60 >60    LIVER FUNCTION TESTS: Recent Labs    08/16/23 0538  BILITOT 1.3*  AST 22  ALT 14  ALKPHOS 58  PROT 6.0*  ALBUMIN 2.1*    Assessment and Plan:  Loculated right pleural effusion s/p right chest tube placement 08/17/23 Dr. Jenna.   Imaging from 08/26/23 revealed resolved right sided pneumothorax.  Critical care consulted interventional radiology for possible removal of the right chest tube.   Imaging was reviewed by Dr. Jenna and approved for removal.    Using a suture removal kit, the external suture of the right chest tube was cut and removed.  The right chest tube, 10 F was subsequently cut, releasing the internal suture, and removed without complication.    The right chest tube site was cleaned and dressed appropriately.   Please call IR with any questions.    Electronically Signed: Laymon Coast, NP 08/27/2023, 8:56 AM    I spent a total of 25 Minutes at the the patient's bedside AND on the patient's hospital floor or unit, greater  than 50% of which was counseling/coordinating care for Loculated right pleural effusion s/p right chest tube placement 08/17/23 Dr. Jenna.

## 2023-08-27 NOTE — Progress Notes (Signed)
 Nutrition Follow Up  DOCUMENTATION CODES:   Severe malnutrition in context of chronic illness, Underweight  INTERVENTION:   Continue Ensure Plus High Protein po BID, each supplement provides 350 kcal and 20 grams of protein.  Continued adequate intake of DYS 2 diet to meet calorie and protein needs  Monitor for results of lung biopsy   Continue Magic cup TID with meals, each supplement provides 290 kcal and 9 grams of protein to help pt meet protein needs  NUTRITION DIAGNOSIS:   Severe Malnutrition related to chronic illness (COPD, arthritis) as evidenced by severe fat depletion, severe muscle depletion, energy intake < or equal to 75% for > or equal to 1 month, percent weight loss. Remains applicable  GOAL:   Patient will meet greater than or equal to 90% of their needs Progressing  MONITOR:   PO intake, Supplement acceptance  REASON FOR ASSESSMENT:   Consult Assessment of nutrition requirement/status  ASSESSMENT:   Pt with hx of COPD, HTN, arthritis, and tobacco use (recently quit). Admitted for SOB, diagnosed with community acquired pneumonia.  7/24 admitted 7/25 IR for CT placement, SLP bedside eval recommend NPO with follow up MBS after CT placement 7/26 MBS conducted, SLP recommends DYS 2  Pt resting in bed at time of assessment. Pt's brothers at bedside. Pt reports good appetite and reports eating majority of breakfast (80% documented in diet summary). No diet documentation for last few days, but pt reports he ate well over the weekend. Pt has been eating magic cups on trays and drinking 1-2 Ensures per day. Discussed continued adequate intake to help meet calorie and protein needs, pt agreeable. Pt reports no GI discomforts at this time and states he is feeling better. Lung biopsy results still pending after suspicion of malignancy. Plan for prolonged antibiotic course if malignancy workup is negative.   Will continue to monitor results of biopsy and pt's intake  to adjust supplements if needed.  Medications reviewed and include:  Senna  Labs reviewed  Diet Order:   Diet Order             DIET DYS 2 Room service appropriate? Yes; Fluid consistency: Thin  Diet effective now                   EDUCATION NEEDS:   Education needs have been addressed  Skin:  Skin Assessment: Reviewed RN Assessment  Last BM:  7/27 type 6  Height:   Ht Readings from Last 1 Encounters:  08/20/23 6' 3 (1.905 m)    Weight:   Wt Readings from Last 1 Encounters:  08/20/23 47.9 kg    Ideal Body Weight:  89.1 kg  BMI:  Body mass index is 13.2 kg/m.  Estimated Nutritional Needs:   Kcal:  1600-1900  Protein:  70-90g  Fluid:  1.6-1.9L   Josette Glance, MS, RDN, LDN Clinical Dietitian I Please reach out via secure chat

## 2023-08-27 NOTE — Plan of Care (Signed)

## 2023-08-27 NOTE — Progress Notes (Signed)
 [facility]                                                                              Fred Wise, is a 68 y.o. male, DOB - 01/27/1955, FMW:[id number] Admit date - 08/16/2023    Outpatient Primary MD for the patient is [name], [name], MD  LOS - 11  days  Chief Complaint  Patient presents with   Shortness of Breath       Brief summary   Patient is a 68 year old male with HTN, smoker, failure to thrive, presented to the hospital with complaints of cough and shortness of breath. Found to have right lower lobe pneumonia as well as possible parapneumonic effusion. Pulmonary consulted. Underwent IR guided chest tube placement on 7/25.   Assessment & Plan      Right lower lobe pneumonia.  Multifocal pneumonia, Right pleural effusion with loculation. Possible lung mass. Post chest tube insertion pneumothorax -Pulmonology following, possible PNA versus malignancy  - Underwent IR guided chest tube insertion on 7/25. - LDH elevated suggesting exudative fluid.  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 -Pleural lytic therapy done by pulmonary on 7/26. -Bronchoscopy completed 7/30, CXR 7/31 with small to moderate PTX - Chest tube was placed, per pulmonology, not a surgical candidate due to severe debility and cachexia. -Biopsy still pending, cytology negative  - CT chest 8/1 showed right sided moderate hydropneumothorax, Persistent consolidations involving the right middle and lower lobe and to lesser extent left lower lobe with severe volume loss and rightward mediastinal shift. Underlying malignancy cannot be excluded. Innumerable pulmonary nodules.  - CXR 8/3 showed resolved right-sided pneumothorax.  Pulmonology recommended IR to remove chest tube today  -Further management per pulmonology, per Dr. Meade, if malignancy workup is negative, will need prolonged antibiotic  course with Augmentin  and follow-up CT in office   Dysphagia. SLP evaluation, MBS completed, recommended dysphagia 2 diet, thin liquids -Tolerating diet without difficulty   Acute metabolic encephalopathy. Likely in the setting of poor p.o. intake as well as infection. Mentation improving.  No focal deficit.    Former smoker. Quit 3 months ago.  Borderline BP with mild sinus tachycardia - Continue midodrine  5 mg 3 times daily  Hyponatremia - Trending down, may have underlying SIADH, will check serum osmolality, urine osmolality, UNA  Severe protein calorie malnutrition, adult failure to thrive Nutrition Problem: Severe Malnutrition Etiology: chronic illness (COPD, arthritis)  Signs/Symptoms: severe fat depletion, severe muscle depletion, energy intake < or equal to 75% for > or equal to 1 month, percent weight loss Percent weight loss: 23 % (in 7 months) Interventions: Ensure Enlive (each supplement provides 350kcal and 20 grams of protein), Magic cup, Liberalize Diet  Underweight (BMI < 18.5) Estimated body mass index is 13.2 kg/m as calculated from the following:   Height as of this encounter: 6' 3 (1.905 m).   Weight as of this encounter: 47.9 kg.  Code Status: Full code DVT Prophylaxis:  enoxaparin  (LOVENOX ) injection 40 mg Start: 08/16/23 1100   Level of Care: Level of care: Telemetry Medical Family Communication: Updated patient Disposition  Plan:      Remains inpatient appropriate:   Once cleared by pulmonology   Procedures:    Consultants:   PCCM  Antimicrobials:   Anti-infectives (From admission, onward)    Start     Dose/Rate Route Frequency Ordered Stop   08/17/23 1000  cefTRIAXone  (ROCEPHIN ) 1 g in sodium chloride  0.9 % 100 mL IVPB  Status:  Discontinued        1 g 200 mL/hr over 30 Minutes Intravenous Daily 08/16/23 1049 08/16/23 1100   08/17/23 1000  cefTRIAXone  (ROCEPHIN ) 2 g in sodium chloride  0.9 % 100 mL IVPB        2 g 200 mL/hr over 30  Minutes Intravenous Every 24 hours 08/16/23 1049 08/20/23 1345   08/17/23 1000  azithromycin  (ZITHROMAX ) tablet 500 mg        500 mg Oral Daily 08/16/23 1049 08/20/23 0804   08/16/23 1115  cefTRIAXone  (ROCEPHIN ) 1 g in sodium chloride  0.9 % 100 mL IVPB        1 g 200 mL/hr over 30 Minutes Intravenous  Once 08/16/23 1100 08/16/23 1214   08/16/23 0715  cefTRIAXone  (ROCEPHIN ) 1 g in sodium chloride  0.9 % 100 mL IVPB        1 g 200 mL/hr over 30 Minutes Intravenous  Once 08/16/23 0711 08/16/23 0824   08/16/23 0715  azithromycin  (ZITHROMAX ) 500 mg in sodium chloride  0.9 % 250 mL IVPB        500 mg 250 mL/hr over 60 Minutes Intravenous  Once 08/16/23 0711 08/16/23 0921          Medications  acetaminophen   1,000 mg Oral Q8H   enoxaparin  (LOVENOX ) injection  40 mg Subcutaneous Q24H   feeding supplement  237 mL Oral BID BM   guaiFENesin   600 mg Oral BID   midodrine   5 mg Oral TID WC   senna-docusate  1 tablet Oral BID   sodium chloride  flush  10 mL Intrapleural Q8H   sodium chloride  flush  3 mL Intravenous Q12H      Subjective:   Fred Wise was seen and examined today.  No acute issues, seen this morning still had chest tube.  Eating breakfast without difficulty.  Pleasant, no nausea vomiting, fever chills, chest pain.  Objective:   Vitals:   08/26/23 2048 08/27/23 0025 08/27/23 0540 08/27/23 0728  BP: (!) 113/91 106/70 110/70 (!) 101/57  Pulse: (!) 116 (!) 101 (!) 110 (!) 115  Resp: 18  18 18   Temp: (!) 97.5 F (36.4 C) 97.6 F (36.4 C) 97.7 F (36.5 C) 97.6 F (36.4 C)  TempSrc: Oral Oral Oral   SpO2: 96% 95% 96% 94%  Weight:      Height:        Intake/Output Summary (Last 24 hours) at 08/27/2023 1143 Last data filed at 08/27/2023 9167 Gross per 24 hour  Intake 210 ml  Output 0 ml  Net 210 ml     Wt Readings from Last 3 Encounters:  08/20/23 47.9 kg  10/31/21 58.5 kg  10/20/21 58.5 kg    Physical Exam General: Alert and oriented x 3, NAD,  cachectic Cardiovascular: S1 S2 clear, RRR.  Respiratory: dec breath sounds at the bases Gastrointestinal: Soft, nontender, nondistended, NBS Ext: no pedal edema bilaterally Neuro: no new deficits Psych: Normal affect, pleasant  Data Reviewed:  I have personally reviewed following labs    CBC Lab Results  Component Value Date   WBC 10.9 (H) 08/26/2023   RBC 4.47  08/26/2023   HGB 12.5 (L) 08/26/2023   HCT 38.0 (L) 08/26/2023   MCV 85.0 08/26/2023   MCH 28.0 08/26/2023   PLT 638 (H) 08/26/2023   MCHC 32.9 08/26/2023   RDW 15.9 (H) 08/26/2023   LYMPHSABS 1.2 08/16/2023   MONOABS 0.9 08/16/2023   EOSABS 0.4 08/16/2023   BASOSABS 0.1 08/16/2023     Last metabolic panel Lab Results  Component Value Date   NA 130 (L) 08/26/2023   K 4.4 08/26/2023   CL 95 (L) 08/26/2023   CO2 22 08/26/2023   BUN 15 08/26/2023   CREATININE 0.63 08/26/2023   GLUCOSE 91 08/26/2023   GFRNONAA >60 08/26/2023   GFRAA >60 02/16/2018   CALCIUM 9.1 08/26/2023   PHOS 2.7 08/19/2023   PROT 6.0 (L) 08/16/2023   ALBUMIN 2.1 (L) 08/16/2023   BILITOT 1.3 (H) 08/16/2023   ALKPHOS 58 08/16/2023   AST 22 08/16/2023   ALT 14 08/16/2023   ANIONGAP 13 08/26/2023    CBG (last 3)  No results for input(s): GLUCAP in the last 72 hours.    Coagulation Profile: No results for input(s): INR, PROTIME in the last 168 hours.    Radiology Studies: I have personally reviewed the imaging studies  DG CHEST PORT 1 VIEW Result Date: 08/27/2023 EXAM: 1 VIEW XRAY OF THE CHEST 08/27/2023 05:45:13 AM COMPARISON: 08/26/2023 CLINICAL HISTORY: [id number] Chest tube in place [id number]. Right chest tube, hx pneumonia; rover FINDINGS: LUNGS AND PLEURA: Coarse airspace opacities right greater than left with relative sparing of the upper lobes, perhaps marginally improved from previous exam. Pigtail catheter remains laterally at the right lung base with persistent, perhaps slightly increased, moderately large effusion.  Rightward mediastinal shift. HEART AND MEDIASTINUM: Rightward mediastinal shift. BONES AND SOFT TISSUES: No acute osseous abnormality. IMPRESSION: 1. Pigtail catheter in place at the right lung base with persistent, perhaps slightly increased, moderately large effusion. 2. Coarse airspace opacities, right greater than left, with relative sparing of the upper lobes, perhaps marginally improved from previous exam. 3. Rightward mediastinal shift. Electronically signed by: Dayne Hassell MD 08/27/2023 07:41 AM EDT RP Workstation: [id number]   DG CHEST PORT 1 VIEW Result Date: 08/26/2023 EXAM: 1 VIEW XRAY OF THE CHEST 08/26/2023 06:25:00 AM COMPARISON: AP radiograph of the chest dated 08/23/2023. CLINICAL HISTORY: [id number] Pneumothorax. Reason for exam: pt order states pneumothorax. FINDINGS: LUNGS AND PLEURA: Since the previous study, a right-sided pneumothorax has resolved. There is persistent hazy, streaky opacification of the right lung and elevation of the right hemidiaphragm. There is also right lateral pleural effusion/pleural thickening. There are coarse interstitial opacities present throughout the left mid and lower lung zones. HEART AND MEDIASTINUM: The heart is normal in size. BONES AND SOFT TISSUES: No acute osseous abnormality. IMPRESSION: 1. Resolved right-sided pneumothorax. 2. Persistent hazy, streaky opacification of the right lung, elevation of the right hemidiaphragm, and right lateral pleural effusion/thickening. 3. Coarse interstitial opacities in the left mid and lower lung zones. Electronically signed by: evalene coho 08/26/2023 08:05 AM EDT RP Workstation: [id number]       Nydia Distance M.D. [facility] 08/27/2023, 11:43 AM  Available via Epic secure chat 7am-7pm After 7 pm, please refer to night coverage provider listed on amion.

## 2023-08-27 NOTE — Plan of Care (Signed)

## 2023-08-27 NOTE — Progress Notes (Addendum)
 NAME:  Fred Wise, MRN:  [id number], DOB:  10/23/1955, LOS: 11 ADMISSION DATE:  08/16/2023, CONSULTATION DATE: 7/24 REFERRING MD: MD Claudene with [facility] CHIEF COMPLAINT: Shortness of breath  History of Present Illness:  Pt is a 68 yr old male with pmhx significant of COPD, HTN, arthritis, and tobacco abuse (quit smoking 3 months ago) presents by EMS from home to [facility] emergency department for shortness of breath, which has been going on for about over a week per her report. Initial workup in the emergency department patient was given Solu-Medrol  125 mg IV and O2 saturations were low 90s on room air, send not needing oxygen  at this standpoint.  Noted to patient to have tachypnea, tachycardia, and blood pressure mainly normotensive to slightly hypertensive.  Initial lab work revealed sodium of 136, potassium of 4.5, creatinine 0.75, BUN 16, WBC of 11.7, hemoglobin 13.6, BNP 69, troponin high-sensitivity 11, procalcitonin 0.57  Chest x-ray-indicated dense consolidation of the right lung along with right pleural effusion CT angio also completed-negative for PE, but concern for multilobar pneumonia, moderate side pleural effusion and and scattered nodules within the left lung-noted to be complex/extensive recommendations by radiologist for repeat imaging within 4 weeks.   PCCM consulted for possible bronc/Thora in regards to CT scan findings.  Upon assessing patient, history was difficult to obtain from patient due to orientation being off.  Patient could tell us  that he was in the hospital but not but not and not really why that he was in the hospital.  Most history was obtained by brother at bedside along with family friend.  Per patient's brother, patient started having increased phlegm over the last week which was thick and white in consistency.  Also brother endorsed that patient has been coughing more often as well as had significant weight loss since Christmas but unable to tell amount of weight  lost.  Brother believes that patient has quit smoking over the last 2 to 3 months.  Patient's brother and family, could only tell me that patient may have been smoking for at least 1 pack a day for 30 to 40 years.  Brother and family member denies patient having chills, fever, any sick contacts, and or significant bleeding.  Brother also stated that patient did drive to doctor's appointment this past Tuesday and fell in the parking lot.  Not able to determine if this was witnessed or not.  In regards to the mentation/lack of orientation, patient's brother stated that mentation has been getting worse over the last couple months  Pertinent  Medical History   Past Medical History:  Diagnosis Date   Arthritis    knees, uses wheelchair   Hypertension      Significant Hospital Events: Including procedures, antibiotic start and stop dates in addition to other pertinent events   7/24: pccm consult for loculated effusion  7/25:  IR pigtail CT placement >> serosanguinous fluid 7/26 >> About 1 L serosanguineous fluid, lytics 7/30 bronchoscopy >> narrowing of right lower lobe bronchus, could not be traversed, brushings BAL transbronchial biopsy   Interim History / Subjective:  No overnight issues.  <30cc out in 24/hrs Objective    Blood pressure (!) 101/57, pulse (!) 115, temperature 97.6 F (36.4 C), resp. rate 18, height 6' 3 (1.905 m), weight 47.9 kg, SpO2 94%.        Intake/Output Summary (Last 24 hours) at 08/27/2023 1303 Last data filed at 08/27/2023 9167 Gross per 24 hour  Intake 210 ml  Output 0  ml  Net 210 ml   Filed Weights   08/16/23 1647 08/17/23 1152 08/20/23 1337  Weight: 44 kg 44 kg 47.9 kg    Examination: General frail 68 year old male he is laying in bed. Not in distress HENT Temporal wasting MMM Pulm dec on left no accessory use still on [place] at 2lpm PCXR w/ slight worsening of right sided airspace disease Card rrr Abd soft Ext warm and dry  Neuro awake, oriented  moves all ext.   Resolved problem list   Assessment and Plan  Multilobar pneumonia/CAP Concern for malignancy Weight loss - unknown over months  Right parapneumonic effusion status post pigtail >>hydropneumothorax COPD, recent tobacco use cessation Dysphagia w/ severe protein calorie malnutrtion and FFT Altered mental status -resolved, CT head  negative.  Hypertension Arthritis Tobacco abuse Pleural fluid exudative by LDH, low protein, lymphocytic   Active Pulm prob list Dyspnea secondary to multilobar PNA c/b parapneumonic right effusion/complicated pleural space, prob COPD w/ chronic dysphagia  -working dx aspiration vs malignancy (favoring aspiration but certainly high risk of malignancy) -s/p bronch 7/30 -s/p tube removal today  Plan Awaiting final cytology, if negative treat w/ prolonged abx (would do at least 14-21d  abx w/ ct imaging and f/u in outpt (have contacted our office)  Start augmentin   Cont PRN duoneb for now  Will need out pt PFTs Dysphagia precautions  Needs PT/OT consult. Not clear to me at this point that dc to home would facilitate optimal recovery... ? Need rehab in SNF setting??

## 2023-08-27 NOTE — Progress Notes (Signed)
 Critical care attending attestation note:   Patient seen and examined and relevant ancillary tests reviewed.  I agree with the assessment and plan of care as outlined by NP Maude Banner.    Summary of Assessment and Plan   65 M w pmh of COPD, HTN and tobacco use. P/w SOB.  Found to have B/L R>L consolidation w right effusion. S/p Thora and then subsequently CXT w lytics. Fluid consistent with exudate lymph pred. Cytology 7/25 and 7/28 neg for malignant cell. Also had a bronch done. RLL bronchus narrowing. TBBX done. Results pending. Cultures have been neg.   Pertinent Physical Exam:  Lung w/o wheeze. Diminished BS in lower lung fields.  Cachetic.   Labs and Radiology reviewed.  A/P:   CAP vs Malignancy Effusion: parapneumonic vs malignancy. However, cytology x2 neg. TBBX biopsy pending. 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  Cachexia  - plan follow up on biopsy results.  - CT abdomen and pelvis w contrast when able to complete the staging purposes. Suspicion high for malignancy especially since we do not have prior CT chest in recent past and with significant cachexia and smoking history.  - rest of the plan per NP note.     Performed by: Fletcher Ostermiller D Koa Zoeller.     Total care time: 25 minutes  Nathyn Luiz JONETTA Fredericks, MD Pulmonary, Critical Care and Sleep Attending.  Pager: [phone]  08/27/2023, 4:52 PM

## 2023-08-28 ENCOUNTER — Inpatient Hospital Stay (HOSPITAL_COMMUNITY)

## 2023-08-28 ENCOUNTER — Other Ambulatory Visit: Payer: Self-pay

## 2023-08-28 DIAGNOSIS — R5383 Other fatigue: Secondary | ICD-10-CM

## 2023-08-28 DIAGNOSIS — C3431 Malignant neoplasm of lower lobe, right bronchus or lung: Secondary | ICD-10-CM | POA: Diagnosis not present

## 2023-08-28 DIAGNOSIS — J90 Pleural effusion, not elsewhere classified: Secondary | ICD-10-CM

## 2023-08-28 DIAGNOSIS — G9340 Encephalopathy, unspecified: Secondary | ICD-10-CM | POA: Diagnosis not present

## 2023-08-28 DIAGNOSIS — C787 Secondary malignant neoplasm of liver and intrahepatic bile duct: Secondary | ICD-10-CM | POA: Diagnosis not present

## 2023-08-28 DIAGNOSIS — R634 Abnormal weight loss: Secondary | ICD-10-CM | POA: Diagnosis not present

## 2023-08-28 DIAGNOSIS — R627 Adult failure to thrive: Secondary | ICD-10-CM

## 2023-08-28 DIAGNOSIS — J189 Pneumonia, unspecified organism: Secondary | ICD-10-CM | POA: Diagnosis not present

## 2023-08-28 DIAGNOSIS — D649 Anemia, unspecified: Secondary | ICD-10-CM

## 2023-08-28 DIAGNOSIS — E46 Unspecified protein-calorie malnutrition: Secondary | ICD-10-CM

## 2023-08-28 DIAGNOSIS — F17210 Nicotine dependence, cigarettes, uncomplicated: Secondary | ICD-10-CM

## 2023-08-28 LAB — RENAL FUNCTION PANEL
Albumin: 1.8 g/dL — ABNORMAL LOW (ref 3.5–5.0)
Anion gap: 10 (ref 5–15)
BUN: 16 mg/dL (ref 8–23)
CO2: 24 mmol/L (ref 22–32)
Calcium: 9 mg/dL (ref 8.9–10.3)
Chloride: 96 mmol/L — ABNORMAL LOW (ref 98–111)
Creatinine, Ser: 0.59 mg/dL — ABNORMAL LOW (ref 0.61–1.24)
GFR, Estimated: >60 mL/min (ref >60)
Glucose, Bld: 94 mg/dL (ref 70–99)
Phosphorus: 3.2 mg/dL (ref 2.5–4.6)
Potassium: 4.4 mmol/L (ref 3.5–5.1)
Sodium: 130 mmol/L — ABNORMAL LOW (ref 135–145)

## 2023-08-28 LAB — CBC
HCT: 35.7 % — ABNORMAL LOW (ref 39.0–52.0)
Hemoglobin: 11.8 g/dL — ABNORMAL LOW (ref 13.0–17.0)
MCH: 28.2 pg (ref 26.0–34.0)
MCHC: 33.1 g/dL (ref 30.0–36.0)
MCV: 85.2 fL (ref 80.0–100.0)
Platelets: 648 K/uL — ABNORMAL HIGH (ref 150–400)
RBC: 4.19 MIL/uL — ABNORMAL LOW (ref 4.22–5.81)
RDW: 16.1 % — ABNORMAL HIGH (ref 11.5–15.5)
WBC: 12 K/uL — ABNORMAL HIGH (ref 4.0–10.5)
nRBC: 0 % (ref 0.0–0.2)

## 2023-08-28 LAB — CYTOLOGY - NON PAP

## 2023-08-28 LAB — SURGICAL PATHOLOGY

## 2023-08-28 MED ORDER — SMOG ENEMA
960.0000 mL | Freq: Once | RECTAL | Status: AC
  Administered 2023-08-28: 960 mL via RECTAL
  Filled 2023-08-28: qty 960

## 2023-08-28 MED ORDER — POLYETHYLENE GLYCOL 3350 17 G PO PACK
17.0000 g | PACK | Freq: Two times a day (BID) | ORAL | Status: DC
  Administered 2023-08-28 – 2023-08-29 (×3): 17 g via ORAL
  Filled 2023-08-28 (×3): qty 1

## 2023-08-28 MED ORDER — GADOBUTROL 1 MMOL/ML IV SOLN
4.0000 mL | Freq: Once | INTRAVENOUS | Status: AC | PRN
  Administered 2023-08-28: 4 mL via INTRAVENOUS

## 2023-08-28 MED ORDER — POLYETHYLENE GLYCOL 3350 17 G PO PACK: 17.0000 g | PACK | Freq: Every day | ORAL | Status: DC

## 2023-08-28 NOTE — Telephone Encounter (Signed)
 This will need an order placed for the CT

## 2023-08-28 NOTE — NC FL2 (Signed)
   MEDICAID FL2 LEVEL OF CARE FORM     IDENTIFICATION  Patient Name: Fred Wise Birthdate: 01/21/1956 Sex: male Admission Date (Current Location): 08/16/2023  [facility] and [place] Number:  Producer, television/film/video and Address:  [facility] [place], [address], [place], [place] [address]      Provider Number: [id number]  Attending Physician Name and Address:  Davia Nydia POUR, MD  Relative Name and Phone Number:  [name] [phone]    Current Level of Care: Hospital Recommended Level of Care: Skilled Nursing Facility Prior Approval Number:    Date Approved/Denied:   PASRR Number: [id number]  Discharge Plan: SNF    Current Diagnoses: Patient Active Problem List   Diagnosis Date Noted   Multifocal pneumonia 08/16/2023   Leukocytosis 08/16/2023   Pleural effusion 08/16/2023   Acute encephalopathy 08/16/2023   Weight loss 08/16/2023   Tobacco abuse 08/16/2023   Thrombocytosis 08/16/2023   Underweight (BMI < 18.5) 08/16/2023   Protein-calorie malnutrition, severe (HCC) 02/17/2018   Influenza A 02/16/2018   Hypoxia 02/16/2018   Essential hypertension 02/16/2018    Orientation RESPIRATION BLADDER Height & Weight     Self, Time, Situation, Place  O2 Continent Weight: 105 lb 9.6 oz (47.9 kg) (with blankets on bed) Height:  6' 3 (190.5 cm)  BEHAVIORAL SYMPTOMS/MOOD NEUROLOGICAL BOWEL NUTRITION STATUS      Continent Diet (see dc summary)  AMBULATORY STATUS COMMUNICATION OF NEEDS Skin   Extensive Assist Verbally Normal                       Personal Care Assistance Level of Assistance  Bathing, Feeding, Dressing Bathing Assistance: Limited assistance Feeding assistance: Independent Dressing Assistance: Limited assistance     Functional Limitations Info  Sight, Hearing, Speech Sight Info: Adequate Hearing Info: Adequate Speech Info: Adequate    SPECIAL CARE FACTORS FREQUENCY  PT (By licensed PT), OT (By licensed OT)     PT  Frequency: 5x a week OT Frequency: 5x a week            Contractures Contractures Info: Not present    Additional Factors Info  Code Status, Allergies Code Status Info: full Allergies Info: NKA           Current Medications (08/28/2023):  This is the current hospital active medication list Current Facility-Administered Medications  Medication Dose Route Frequency Provider Last Rate Last Admin   acetaminophen  (TYLENOL ) tablet 1,000 mg  1,000 mg Oral Q8H Patel, Pranav M, MD   1,000 mg at 08/28/23 1457   enoxaparin  (LOVENOX ) injection 40 mg  40 mg Subcutaneous Q24H Smith, Rondell A, MD   40 mg at 08/28/23 1026   feeding supplement (ENSURE PLUS HIGH PROTEIN) liquid 237 mL  237 mL Oral BID BM Smith, Rondell A, MD   237 mL at 08/28/23 1457   guaiFENesin  (MUCINEX ) 12 hr tablet 600 mg  600 mg Oral BID Smith, Rondell A, MD   600 mg at 08/28/23 1026   ipratropium-albuterol  (DUONEB) 0.5-2.5 (3) MG/3ML nebulizer solution 3 mL  3 mL Nebulization Q4H PRN Autry, Lauren E, PA-C       midodrine  (PROAMATINE ) tablet 5 mg  5 mg Oral TID WC Rai, Ripudeep K, MD   5 mg at 08/28/23 1300   morphine  (PF) 2 MG/ML injection 2 mg  2 mg Intravenous Q4H PRN Patel, Pranav M, MD   2 mg at 08/19/23 0530   Oral care mouth rinse  15 mL  Mouth Rinse PRN Tobie Yetta HERO, MD       oxyCODONE  (Oxy IR/ROXICODONE ) immediate release tablet 5 mg  5 mg Oral Q4H PRN Patel, Pranav M, MD   5 mg at 08/27/23 2100   polyethylene glycol (MIRALAX  / GLYCOLAX ) packet 17 g  17 g Oral BID Rai, Ripudeep K, MD   17 g at 08/28/23 1026   senna-docusate (Senokot-S) tablet 1 tablet  1 tablet Oral BID Patel, Pranav M, MD   1 tablet at 08/28/23 1026   sodium chloride  flush (NS) 0.9 % injection 10 mL  10 mL Intrapleural Q8H Jude Donning V, MD   10 mL at 08/28/23 1033   sodium chloride  flush (NS) 0.9 % injection 3 mL  3 mL Intravenous Q12H Claudene Reeves A, MD   3 mL at 08/28/23 1032     Discharge Medications: Please see discharge summary for a  list of discharge medications.  Relevant Imaging Results:  Relevant Lab Results:   Additional Information SSN [id number]  [place], LCSW

## 2023-08-28 NOTE — Progress Notes (Addendum)
 [facility]                                                                              Fred Wise, is a 68 y.o. male, DOB - [birth year], FMW:[id number] Admit date - 08/16/2023    Outpatient Primary MD for the patient is [name], [name], MD  LOS - 12  days  Chief Complaint  Patient presents with   Shortness of Breath       Brief summary   Patient is a 68 year old male with HTN, smoker, failure to thrive, presented to the hospital with complaints of cough and shortness of breath. Found to have right lower lobe pneumonia as well as possible parapneumonic effusion. Pulmonary consulted. Underwent IR guided chest tube placement on 7/25.   Assessment & Plan      Right lower lobe pneumonia.  Multifocal pneumonia, Right pleural effusion with loculation. Possible lung mass. Post chest tube insertion pneumothorax -Pulmonology following, possible PNA versus malignancy  - Underwent IR guided chest tube insertion on 7/25. - LDH elevated suggesting exudative fluid.  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 -Pleural lytic therapy done by pulmonary on 7/26. -Bronchoscopy completed 7/30, CXR 7/31 with small to moderate PTX - Chest tube was placed, removed on 08/27/23 - CT chest 8/1 showed right sided moderate hydropneumothorax, Persistent consolidations involving the right middle and lower lobe and to lesser extent left lower lobe with severe volume loss and rightward mediastinal shift. Underlying malignancy cannot be excluded. Innumerable pulmonary nodules.  - Biopsy+ adenocarcinoma, poorly differentiated, primary lung.  CT abdpelvis No mets, rectal stool ball.  -Discussed with pulmonology, Dr. Theodoro, DC'd antibiotics - Consulted oncology, Dr. Sherrod, ordered MRI brain for complete staging.  PCCM will discuss with CT surgery if surgery an option.    Dysphagia. SLP evaluation,  MBS completed, recommended dysphagia 2 diet, thin liquids -Tolerating diet without difficulty   Acute metabolic encephalopathy. Likely in the setting of poor p.o. intake as well as infection. Mentation improving.  No focal deficit.    Former smoker. Quit 3 months ago.  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, serum osmolality 283, pending urine osmolality, UNA   Severe protein calorie malnutrition, adult failure to thrive Nutrition Problem: Severe Malnutrition Etiology: chronic illness (COPD, arthritis)  Signs/Symptoms: severe fat depletion, severe muscle depletion, energy intake < or equal to 75% for > or equal to 1 month, percent weight loss Percent weight loss: 23 % (in 7 months) Interventions: Ensure Enlive (each supplement provides 350kcal and 20 grams of protein), Magic cup, Liberalize Diet  -PT OT evaluation, nutritional supplements  Underweight (BMI < 18.5) Estimated body mass index is 13.2 kg/m as calculated from the following:   Height as of this encounter: 6' 3 (1.905 m).   Weight as of this encounter: 47.9 kg.  Code Status: Full code DVT Prophylaxis:  enoxaparin  (LOVENOX ) injection 40 mg Start: 08/16/23 1100   Level of Care: Level of care: Telemetry Medical Family Communication: Updated patient Disposition Plan:  Remains inpatient appropriate:      Procedures:    Consultants:   PCCM Oncology  Antimicrobials:   Anti-infectives (From admission, onward)    Start     Dose/Rate Route Frequency Ordered Stop   08/27/23 2000  amoxicillin -clavulanate (AUGMENTIN ) 875-125 MG per tablet 1 tablet  Status:  Discontinued        1 tablet Oral Every 12 hours 08/27/23 1517 08/28/23 1110   08/17/23 1000  cefTRIAXone  (ROCEPHIN ) 1 g in sodium chloride  0.9 % 100 mL IVPB  Status:  Discontinued        1 g 200 mL/hr over 30 Minutes Intravenous Daily 08/16/23 1049  08/16/23 1100   08/17/23 1000  cefTRIAXone  (ROCEPHIN ) 2 g in sodium chloride  0.9 % 100 mL IVPB        2 g 200 mL/hr over 30 Minutes Intravenous Every 24 hours 08/16/23 1049 08/20/23 1345   08/17/23 1000  azithromycin  (ZITHROMAX ) tablet 500 mg        500 mg Oral Daily 08/16/23 1049 08/20/23 0804   08/16/23 1115  cefTRIAXone  (ROCEPHIN ) 1 g in sodium chloride  0.9 % 100 mL IVPB        1 g 200 mL/hr over 30 Minutes Intravenous  Once 08/16/23 1100 08/16/23 1214   08/16/23 0715  cefTRIAXone  (ROCEPHIN ) 1 g in sodium chloride  0.9 % 100 mL IVPB        1 g 200 mL/hr over 30 Minutes Intravenous  Once 08/16/23 0711 08/16/23 0824   08/16/23 0715  azithromycin  (ZITHROMAX ) 500 mg in sodium chloride  0.9 % 250 mL IVPB        500 mg 250 mL/hr over 60 Minutes Intravenous  Once 08/16/23 0711 08/16/23 0921          Medications  acetaminophen   1,000 mg Oral Q8H   enoxaparin  (LOVENOX ) injection  40 mg Subcutaneous Q24H   feeding supplement  237 mL Oral BID BM   guaiFENesin   600 mg Oral BID   midodrine   5 mg Oral TID WC   polyethylene glycol  17 g Oral BID   senna-docusate  1 tablet Oral BID   sodium chloride  flush  10 mL Intrapleural Q8H   sodium chloride  flush  3 mL Intravenous Q12H      Subjective:   Fred Wise was seen and examined today.  Chest tube removed on 8/4, no acute complaints.  Eating breakfast.  No nausea or vomiting, chest pain, shortness of breath, fevers PT eval pending . Objective:   Vitals:   08/27/23 0728 08/27/23 1621 08/27/23 2033 08/28/23 0509  BP: (!) 101/57 115/80 126/73 108/68  Pulse: (!) 115 (!) 110 (!) 110 (!) 111  Resp: 18 17 16 16   Temp: 97.6 F (36.4 C) 97.7 F (36.5 C) 98.1 F (36.7 C) (!) 97.5 F (36.4 C)  TempSrc:  Oral Oral Oral  SpO2: 94% 96% 94% 93%  Weight:      Height:        Intake/Output Summary (Last 24 hours) at 08/28/2023 1318 Last data filed at 08/28/2023 1300 Gross per 24 hour  Intake 350 ml  Output 500 ml  Net -150 ml     Wt  Readings from Last 3 Encounters:  08/20/23 47.9 kg  10/31/21 58.5 kg  10/20/21 58.5 kg   Physical Exam General: Alert and oriented x 3, NAD, cachectic, ill-appearing Cardiovascular: S1 S2 clear, RRR.  Respiratory: Diminished breath sound at the bases Gastrointestinal: Soft, nontender, nondistended, NBS Ext: no pedal edema bilaterally Neuro: no new  deficits Psych: Normal affect     Data Reviewed:  I have personally reviewed following labs    CBC Lab Results  Component Value Date   WBC 12.0 (H) 08/28/2023   RBC 4.19 (L) 08/28/2023   HGB 11.8 (L) 08/28/2023   HCT 35.7 (L) 08/28/2023   MCV 85.2 08/28/2023   MCH 28.2 08/28/2023   PLT 648 (H) 08/28/2023   MCHC 33.1 08/28/2023   RDW 16.1 (H) 08/28/2023   LYMPHSABS 1.2 08/16/2023   MONOABS 0.9 08/16/2023   EOSABS 0.4 08/16/2023   BASOSABS 0.1 08/16/2023     Last metabolic panel Lab Results  Component Value Date   NA 130 (L) 08/28/2023   K 4.4 08/28/2023   CL 96 (L) 08/28/2023   CO2 24 08/28/2023   BUN 16 08/28/2023   CREATININE 0.59 (L) 08/28/2023   GLUCOSE 94 08/28/2023   GFRNONAA >60 08/28/2023   GFRAA >60 02/16/2018   CALCIUM 9.0 08/28/2023   PHOS 3.2 08/28/2023   PROT 6.0 (L) 08/16/2023   ALBUMIN 1.8 (L) 08/28/2023   BILITOT 1.3 (H) 08/16/2023   ALKPHOS 58 08/16/2023   AST 22 08/16/2023   ALT 14 08/16/2023   ANIONGAP 10 08/28/2023    CBG (last 3)  No results for input(s): GLUCAP in the last 72 hours.    Coagulation Profile: No results for input(s): INR, PROTIME in the last 168 hours.    Radiology Studies: I have personally reviewed the imaging studies  CT ABDOMEN PELVIS W CONTRAST Result Date: 08/28/2023 CLINICAL DATA:  Occult malignancy pneumothorax EXAM: CT ABDOMEN AND PELVIS WITH CONTRAST TECHNIQUE: Multidetector CT imaging of the abdomen and pelvis was performed using the standard protocol following bolus administration of intravenous contrast. RADIATION DOSE REDUCTION: This exam was  performed according to the departmental dose-optimization program which includes automated exposure control, adjustment of the mA and/or kV according to patient size and/or use of iterative reconstruction technique. CONTRAST:  75mL OMNIPAQUE  IOHEXOL  350 MG/ML SOLN COMPARISON:  CT chest 08/24/2023 FINDINGS: Lower chest: Persistent partially visualized right moderate volume hydropneumothorax. Redemonstration of right lung partial collapse with limited evaluation of the pulmonary parenchyma. Patchy left lower lobe consolidation. Associated left-to-right midline shift. Scattered left lung pulmonary nodules. Hepatobiliary: Scattered bilateral hepatic fluid dense lesions. No gallstones, gallbladder wall thickening, or pericholecystic fluid. No biliary dilatation. Pancreas: No focal lesion. Normal pancreatic contour. No surrounding inflammatory changes. No main pancreatic ductal dilatation. Spleen: Normal in size without focal abnormality. Adrenals/Urinary Tract: No adrenal nodule bilaterally. Bilateral kidneys enhance symmetrically. No hydronephrosis. No hydroureter. The urinary bladder is unremarkable. On delayed imaging, there is no urothelial wall thickening and there are no filling defects in the opacified portions of the bilateral collecting systems or ureters. Stomach/Bowel: Stomach is within normal limits. No evidence of bowel wall thickening or dilatation. Stool throughout the colon. Rectal stool ball measuring up to 8.5 cm. The appendix is not definitely identified with no inflammatory changes in the right lower quadrant to suggest acute appendicitis. Vascular/Lymphatic: No abdominal aorta or iliac aneurysm. Severe atherosclerotic plaque of the aorta and its branches. No abdominal, pelvic, or inguinal lymphadenopathy. Reproductive: Prostate is unremarkable. Other: No intraperitoneal free fluid. No intraperitoneal free gas. No organized fluid collection. Musculoskeletal: No abdominal wall hernia or abnormality. No  suspicious lytic or blastic osseous lesions. No acute displaced fracture. IMPRESSION: 1. Persistent partially visualized right moderate volume hydropneumothorax. Redemonstration of right lung partial collapse with limited evaluation of the pulmonary parenchyma. 2. Patchy left lower lobe consolidation. 3. Scattered left  lung pulmonary nodules. 4. Constipation with rectal stool ball measuring up to 8.5 cm. 5.  Aortic Atherosclerosis (ICD10-I70.0). Electronically Signed   By: Morgane  Naveau M.D.   On: 08/28/2023 02:00   DG CHEST PORT 1 VIEW Result Date: 08/27/2023 EXAM: 1 VIEW XRAY OF THE CHEST 08/27/2023 05:45:13 AM COMPARISON: 08/26/2023 CLINICAL HISTORY: [id number] Chest tube in place [id number]. Right chest tube, hx pneumonia; rover FINDINGS: LUNGS AND PLEURA: Coarse airspace opacities right greater than left with relative sparing of the upper lobes, perhaps marginally improved from previous exam. Pigtail catheter remains laterally at the right lung base with persistent, perhaps slightly increased, moderately large effusion. Rightward mediastinal shift. HEART AND MEDIASTINUM: Rightward mediastinal shift. BONES AND SOFT TISSUES: No acute osseous abnormality. IMPRESSION: 1. Pigtail catheter in place at the right lung base with persistent, perhaps slightly increased, moderately large effusion. 2. Coarse airspace opacities, right greater than left, with relative sparing of the upper lobes, perhaps marginally improved from previous exam. 3. Rightward mediastinal shift. Electronically signed by: Katheleen Faes MD 08/27/2023 07:41 AM EDT RP Workstation: [id number]       Nydia Distance M.D. [facility] 08/28/2023, 1:18 PM  Available via Epic secure chat 7am-7pm After 7 pm, please refer to night coverage provider listed on amion.

## 2023-08-28 NOTE — Plan of Care (Signed)
  Problem: Clinical Measurements: Goal: Ability to maintain clinical measurements within normal limits will improve Outcome: Progressing Goal: Will remain free from infection Outcome: Progressing Goal: Diagnostic test results will improve Outcome: Progressing Goal: Respiratory complications will improve Outcome: Progressing Goal: Cardiovascular complication will be avoided Outcome: Progressing   Problem: Health Behavior/Discharge Planning: Goal: Ability to manage health-related needs will improve Outcome: Progressing   Problem: Nutrition: Goal: Adequate nutrition will be maintained Outcome: Progressing   Problem: Coping: Goal: Level of anxiety will decrease Outcome: Progressing   Problem: Elimination: Goal: Will not experience complications related to bowel motility Outcome: Progressing Goal: Will not experience complications related to urinary retention Outcome: Progressing   Problem: Pain Managment: Goal: General experience of comfort will improve and/or be controlled Outcome: Progressing   Problem: Safety: Goal: Ability to remain free from injury will improve Outcome: Progressing   Problem: Skin Integrity: Goal: Risk for impaired skin integrity will decrease Outcome: Progressing   Problem: Activity: Goal: Ability to tolerate increased activity will improve Outcome: Progressing   Problem: Respiratory: Goal: Ability to maintain adequate ventilation will improve Outcome: Progressing

## 2023-08-28 NOTE — Consult Note (Addendum)
 ADMISSION DATE:  08/16/2023  SUBJECTIVE:   Doing similar to yesterday. On [place].  Pathology report came back as poor diff adenocarcinoma. Discussed with primary team about the results. Informed the patient.   REVIEW OF SYSTEMS:   Pertinent ROS as per HPI  VITAL SIGNS: Temp:  [97.5 F (36.4 C)-98.1 F (36.7 C)] 97.5 F (36.4 C) (08/05 0509) Pulse Rate:  [110-111] 111 (08/05 0509) Resp:  [16-17] 16 (08/05 0509) BP: (108-126)/(68-80) 108/68 (08/05 0509) SpO2:  [93 %-96 %] 93 % (08/05 0509)  PHYSICAL EXAMINATION: Lung w/o wheeze. Diminished BS in lower lung fields.  Cachetic.  Recent Labs  Lab 08/23/23 0636 08/26/23 0646 08/28/23 0631  NA 134* 130* 130*  K 5.0 4.4 4.4  CL 99 95* 96*  CO2 29 22 24   BUN 13 15 16   CREATININE 0.74 0.63 0.59*  GLUCOSE 104* 91 94   Recent Labs  Lab 08/23/23 0636 08/26/23 0646 08/28/23 0631  HGB 12.2* 12.5* 11.8*  HCT 37.6* 38.0* 35.7*  WBC 9.2 10.9* 12.0*  PLT 523* 638* 648*   CT ABDOMEN PELVIS W CONTRAST Result Date: 08/28/2023 CLINICAL DATA:  Occult malignancy pneumothorax EXAM: CT ABDOMEN AND PELVIS WITH CONTRAST TECHNIQUE: Multidetector CT imaging of the abdomen and pelvis was performed using the standard protocol following bolus administration of intravenous contrast. RADIATION DOSE REDUCTION: This exam was performed according to the departmental dose-optimization program which includes automated exposure control, adjustment of the mA and/or kV according to patient size and/or use of iterative reconstruction technique. CONTRAST:  75mL OMNIPAQUE  IOHEXOL  350 MG/ML SOLN COMPARISON:  CT chest 08/24/2023 FINDINGS: Lower chest: Persistent partially visualized right moderate volume hydropneumothorax. Redemonstration of right lung partial collapse with limited evaluation of the pulmonary parenchyma. Patchy left lower lobe consolidation. Associated left-to-right midline shift. Scattered left lung pulmonary nodules. Hepatobiliary: Scattered  bilateral hepatic fluid dense lesions. No gallstones, gallbladder wall thickening, or pericholecystic fluid. No biliary dilatation. Pancreas: No focal lesion. Normal pancreatic contour. No surrounding inflammatory changes. No main pancreatic ductal dilatation. Spleen: Normal in size without focal abnormality. Adrenals/Urinary Tract: No adrenal nodule bilaterally. Bilateral kidneys enhance symmetrically. No hydronephrosis. No hydroureter. The urinary bladder is unremarkable. On delayed imaging, there is no urothelial wall thickening and there are no filling defects in the opacified portions of the bilateral collecting systems or ureters. Stomach/Bowel: Stomach is within normal limits. No evidence of bowel wall thickening or dilatation. Stool throughout the colon. Rectal stool ball measuring up to 8.5 cm. The appendix is not definitely identified with no inflammatory changes in the right lower quadrant to suggest acute appendicitis. Vascular/Lymphatic: No abdominal aorta or iliac aneurysm. Severe atherosclerotic plaque of the aorta and its branches. No abdominal, pelvic, or inguinal lymphadenopathy. Reproductive: Prostate is unremarkable. Other: No intraperitoneal free fluid. No intraperitoneal free gas. No organized fluid collection. Musculoskeletal: No abdominal wall hernia or abnormality. No suspicious lytic or blastic osseous lesions. No acute displaced fracture. IMPRESSION: 1. Persistent partially visualized right moderate volume hydropneumothorax. Redemonstration of right lung partial collapse with limited evaluation of the pulmonary parenchyma. 2. Patchy left lower lobe consolidation. 3. Scattered left lung pulmonary nodules. 4. Constipation with rectal stool ball measuring up to 8.5 cm. 5.  Aortic Atherosclerosis (ICD10-I70.0). Electronically Signed   By: Morgane  Naveau M.D.   On: 08/28/2023 02:00   DG CHEST PORT 1 VIEW Result Date: 08/27/2023 EXAM: 1 VIEW XRAY OF THE CHEST 08/27/2023 05:45:13 AM  COMPARISON: 08/26/2023 CLINICAL HISTORY: [id number] Chest tube in place [id number]. Right chest tube,  hx pneumonia; rover FINDINGS: LUNGS AND PLEURA: Coarse airspace opacities right greater than left with relative sparing of the upper lobes, perhaps marginally improved from previous exam. Pigtail catheter remains laterally at the right lung base with persistent, perhaps slightly increased, moderately large effusion. Rightward mediastinal shift. HEART AND MEDIASTINUM: Rightward mediastinal shift. BONES AND SOFT TISSUES: No acute osseous abnormality. IMPRESSION: 1. Pigtail catheter in place at the right lung base with persistent, perhaps slightly increased, moderately large effusion. 2. Coarse airspace opacities, right greater than left, with relative sparing of the upper lobes, perhaps marginally improved from previous exam. 3. Rightward mediastinal shift. Electronically signed by: Katheleen Faes MD 08/27/2023 07:41 AM EDT RP Workstation: [id number]    STUDIES:  CXR/CT scan/ECHO  CT chest from 8 1 reviewed again.  There is narrowing of BI.  Likely all engulfed by the tumor.  Tumor size greater than 7 cm making it T3.  However without contrast difficult to delineate invasion.  There are mediastinal lymphadenopathy seen especially station 4L.  Additionally there is opacity on the left which could be part of the same process making it locally metastasize disease (Stage IV A).   ASSESSMENT / PLAN:  48 M w pmh of COPD, HTN and tobacco use. P/w SOB.  Found to have B/L R>L consolidation w right effusion. S/p Thora and then subsequently CXT w lytics. Fluid consistent with exudate lymph pred. Cytology 7/25 and 7/28 neg for malignant cell. Also had a bronch done. RLL bronchus narrowing. TBBX done. Results pending. Cultures have been neg.    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  Effusion: Likely malignant. However, cytology x2 neg. TBBX biopsy pending. S/p Pigtail placement  Trapped lung on the  right.  Cachexia BI narrowing.    - Staging CT abdomen done. Neg for distant mets.  - request for MRI brain for staging completion.  - Unlikely BI stenting will help patient. Suspect there is lymphangiectic spread of the tumor and the lung parenchyma supplied by BI does not have functioning parenchyma. Additionally, does not have SOB.  - Oncology and rad onc consult.  - Discussed with thoracic surgery  Dr Lucas who looked at images: not surgical candidate based on extent of disease, poor functional status and cachexia. - I do not think EBUS staging will change the management unless requested by oncology.  Pulmonary will sign off. Please call with questions.    35 min were spent in reviewing the imaging by me personally, discussing hte diagnosis with patient and discussing the consult with thoracic surgery. Additionally, the findings were discussed with primary team to help manage the patient.   Sammi JONETTA Fredericks, MD Pulmonary, Critical Care & Sleep Medicine [facility] Pager: [phone]  08/28/2023, 1:45 PM

## 2023-08-28 NOTE — Telephone Encounter (Signed)
Order has been placed for CT.

## 2023-08-28 NOTE — Evaluation (Signed)
 Physical Therapy Evaluation Patient Details Name: Fred Wise MRN: [id number] DOB: [birth year] Today's Date: 08/28/2023  History of Present Illness  Pt is a 68 y.o. male presenting 7/24 with shortness of breath, cough with bloody sputum. CT noted concern for multi lobar pneumonia, right lower lobe lung mass could not be ruled out, moderate right-sided pleural effusion, scattered nodules in the left lung that were noted to be complex and extensive in nature.   Chest tube 7/25-8/4, pleural fibrinolysis 7/26,    S/p bronchoscopy, bronchial alveolar lavage of RLL, biopsy of RLL 7/30. PMH: HTN, COPD, arthritis, tobacco abuse.  Clinical Impression  Received pt semi-reclined in bed on 2L O2. Pt reports living alone in apartment with level entry and elevator. Pt reports being independent using RW and WC and driving prior to admission. Pt required CGA to transition to EOB using bed features, min A for sit<>stands with RW, and min A to transition into supine. In standing, pt unable to achieve full trunk/hip/knee extension and immediately returning to sit. Encouraged transferring to recliner, but pt politely refused and insisted on returning to supine. Pt demonstrated SOB with activity - SPO2 91% and educated pt on pursed lip breathing techniques. Recommend follow up therapy <3 hours/day to address current strength, balance, safety, and endurance impairments. Acute PT to cont to follow.       If plan is discharge home, recommend the following: A lot of help with walking and/or transfers;A lot of help with bathing/dressing/bathroom;Assistance with cooking/housework;Assist for transportation   Can travel by private vehicle   No    Equipment Recommendations None recommended by PT (has RW and WC)  Recommendations for Other Services       Functional Status Assessment Patient has had a recent decline in their functional status and demonstrates the ability to make significant improvements in function in a  reasonable and predictable amount of time.     Precautions / Restrictions Precautions Precautions: Fall;Other (comment) (monitor SPO2) Restrictions Weight Bearing Restrictions Per Provider Order: No      Mobility  Bed Mobility Overal bed mobility: Needs Assistance Bed Mobility: Rolling, Supine to Sit, Sit to Supine Rolling: Used rails, Supervision   Supine to sit: Contact guard, HOB elevated, Used rails Sit to supine: Min assist     Patient Response: Cooperative  Transfers Overall transfer level: Needs assistance Equipment used: Rolling walker (2 wheels) Transfers: Sit to/from Stand Sit to Stand: Min assist           General transfer comment: verbal/tactile cues for technique, flexed posture of hips/knees, lacking full hip/knee extension in standing with forward rounding of shoulders. Increased time and effiort to rise and immediately requesting to sit back down - declined transfer to recliner    Ambulation/Gait               General Gait Details: pt politely refused  Stairs            Wheelchair Mobility     Tilt Bed Tilt Bed Patient Response: Cooperative  Modified Rankin (Stroke Patients Only)       Balance Overall balance assessment: Needs assistance Sitting-balance support: No upper extremity supported, Feet supported Sitting balance-Leahy Scale: Fair Sitting balance - Comments: not challenged dynamically this session   Standing balance support: Bilateral upper extremity supported, Reliant on assistive device for balance (RW) Standing balance-Leahy Scale: Poor Standing balance comment: unable to achieve fully upright position and reliant on BUE for support. Required CGA/min A for static standing balance  Pertinent Vitals/Pain Pain Assessment Pain Assessment: 0-10 Pain Score: 6  Pain Location: knees and feet, but did not c/o pain with mobility Pain Intervention(s): Limited activity within  patient's tolerance, Monitored during session, Premedicated before session, Repositioned    Home Living Family/patient expects to be discharged to:: Private residence Living Arrangements: Alone Available Help at Discharge: Family;Available PRN/intermittently Type of Home: Apartment Home Access: Level entry;Elevator       Home Layout: One level Home Equipment: Agricultural consultant (2 wheels);Wheelchair - manual Additional Comments: Pt reports being independent with his RW and his WC and driving    Prior Function Prior Level of Function : Independent/Modified Independent             Mobility Comments: RW vs wheelchair ADLs Comments: independent in ADL; I can do it all     Extremity/Trunk Assessment   Upper Extremity Assessment Upper Extremity Assessment: Defer to OT evaluation    Lower Extremity Assessment Lower Extremity Assessment: Generalized weakness (reports coldness in feet)    Cervical / Trunk Assessment Cervical / Trunk Assessment: Kyphotic  Communication   Communication Communication: No apparent difficulties    Cognition Arousal: Alert Behavior During Therapy: WFL for tasks assessed/performed                             Following commands: Intact       Cueing Cueing Techniques: Verbal cues     General Comments General comments (skin integrity, edema, etc.): on 2L via [place], SpO2 as low as 87% when first checked at EOB; cued for pursed lip breathing, with additional cues to breathe in through his nose; Reading then 91%. DOE with STS transfer and pt requesting to sit down after ~2 seconds standing. SpO2 91% when checked seated at EOB again. Pt with significant decr activity tolerance asking to return to supine; with encouragement, sat 2 mins then return to supine on request, deferring further attempts OOB or activity EOB    Exercises     Assessment/Plan    PT Assessment Patient needs continued PT services  PT Problem List Decreased  strength;Decreased activity tolerance;Decreased balance;Decreased mobility;Decreased coordination;Cardiopulmonary status limiting activity;Pain       PT Treatment Interventions DME instruction;Gait training;Functional mobility training;Therapeutic activities;Therapeutic exercise;Balance training;Neuromuscular re-education;Patient/family education    PT Goals (Current goals can be found in the Care Plan section)  Acute Rehab PT Goals Patient Stated Goal: to regain independence PT Goal Formulation: With patient Time For Goal Achievement: 09/11/23 Potential to Achieve Goals: Fair    Frequency Min 2X/week     Co-evaluation               AM-PAC PT 6 Clicks Mobility  Outcome Measure Help needed turning from your back to your side while in a flat bed without using bedrails?: A Little Help needed moving from lying on your back to sitting on the side of a flat bed without using bedrails?: A Lot Help needed moving to and from a bed to a chair (including a wheelchair)?: A Lot Help needed standing up from a chair using your arms (e.g., wheelchair or bedside chair)?: A Little Help needed to walk in hospital room?: A Lot Help needed climbing 3-5 steps with a railing? : A Lot 6 Click Score: 14    End of Session Equipment Utilized During Treatment: Gait belt Activity Tolerance: Other (comment) (limited by SOB) Patient left: in bed;with call bell/phone within reach;with bed alarm set Nurse Communication: Mobility  status PT Visit Diagnosis: Unsteadiness on feet (R26.81);Other abnormalities of gait and mobility (R26.89);Muscle weakness (generalized) (M62.81);Pain Pain - part of body: Ankle and joints of foot;Knee (knees and feet)    Time: 9198-9173 PT Time Calculation (min) (ACUTE ONLY): 25 min   Charges:   PT Evaluation $PT Eval Moderate Complexity: 1 Mod PT Treatments $Therapeutic Activity: 8-22 mins PT General Charges $$ ACUTE PT VISIT: 1 Visit         Therisa Stains  PT, DPT Therisa HERO Zaunegger 08/28/2023, 9:09 AM

## 2023-08-28 NOTE — Evaluation (Signed)
 Occupational Therapy Evaluation Patient Details Name: Fred Wise MRN: [id number] DOB: Sep 20, 1955 Today's Date: 08/28/2023   History of Present Illness   Pt is a 68 y.o. male presenting 7/24 with shortness of breath, cough with bloody sputum. CT noted concern for multi lobar pneumonia, right lower lobe lung mass could not be ruled out, moderate right-sided pleural effusion, scattered nodules in the left lung that were noted to be complex and extensive in nature.   Chest tube 7/25-8/4, pleural fibrinolysis 7/26,    S/p bronchoscopy, bronchial alveolar lavage of RLL, biopsy of RLL 7/30. PMH: HTN, COPD, arthritis, tobacco abuse.     Clinical Impressions PTA, pt lived alone and when asked about ADL/IADL performance PTA, reports I can do it all. Upon eval, pt with decreased cardiopulmonary endurance, activity tolerance, safety, strength, balance. Pt deconditioned with DOE with bed mobility and STS transfer. After STS with min A with RW; pt requesting to lie back down. Encouraged to remain upright at EOB to recover as SpO2 reading 91%, however, pt continues to ask to return to supine. Pt will continue to benefit from acute OT services to optimize cardiopulmonary endurance and facilitate return to ADL with use of energy conservation strategies as needed. Will continue to follow acutely and recommending inpatient rehab <3 hours at discharge.      If plan is discharge home, recommend the following:   Two people to help with walking and/or transfers;A lot of help with bathing/dressing/bathroom;Assistance with cooking/housework;Assist for transportation;Help with stairs or ramp for entrance     Functional Status Assessment   Patient has had a recent decline in their functional status and demonstrates the ability to make significant improvements in function in a reasonable and predictable amount of time.     Equipment Recommendations   Tub/shower bench     Recommendations for Other  Services         Precautions/Restrictions   Precautions Precautions: Fall;Other (comment) (watch SpO2) Restrictions Weight Bearing Restrictions Per Provider Order: No     Mobility Bed Mobility               General bed mobility comments: up at EOB with PT on arrival    Transfers Overall transfer level: Needs assistance Equipment used: Rolling walker (2 wheels) Transfers: Sit to/from Stand Sit to Stand: Min assist           General transfer comment: verybal/tactile cues for technique, flexed posture of hips/knees, lacking full hip/knee extension in standing with forward rounding of shoulders. Increased time and effiort to rise.      Balance Overall balance assessment: Needs assistance Sitting-balance support: No upper extremity supported, Feet supported Sitting balance-Leahy Scale: Fair Sitting balance - Comments: not challenged dynamically this session   Standing balance support: Bilateral upper extremity supported, Reliant on assistive device for balance Standing balance-Leahy Scale: Poor Standing balance comment: unable to achieve fully upright position and reliant on BUE for support                           ADL either performed or assessed with clinical judgement   ADL Overall ADL's : Needs assistance/impaired Eating/Feeding: Set up;Bed level   Grooming: Set up;Sitting   Upper Body Bathing: Set up;Sitting   Lower Body Bathing: Moderate assistance;Sit to/from stand   Upper Body Dressing : Set up;Sitting   Lower Body Dressing: Moderate assistance;Sit to/from stand   Toilet Transfer: Contact guard assist;Rolling walker (2 wheels) Toilet Transfer Details (indicate cue  type and reason): STS only this session                 Vision Baseline Vision/History: 1 Wears glasses Ability to See in Adequate Light: 0 Adequate Patient Visual Report: No change from baseline Vision Assessment?: No apparent visual deficits     Perception  Perception: Not tested       Praxis Praxis: Not tested       Pertinent Vitals/Pain Pain Assessment Pain Assessment: No/denies pain     Extremity/Trunk Assessment Upper Extremity Assessment Upper Extremity Assessment: Generalized weakness   Lower Extremity Assessment Lower Extremity Assessment: Generalized weakness (pt reports coldness in feet)   Cervical / Trunk Assessment Cervical / Trunk Assessment: Other exceptions (cachetic in appearance)   Communication Communication Communication: No apparent difficulties   Cognition Arousal: Alert Behavior During Therapy: WFL for tasks assessed/performed Cognition: No family/caregiver present to determine baseline             OT - Cognition Comments: poor insight at times, but follows commands, is oriented                 Following commands: Intact       Cueing  General Comments   Cueing Techniques: Verbal cues  on 2L via [place], SpO2 as low as 87% when first checked at EOB; cued for pursed lip breathing, with additional cues to breathe in through his nose; Reading then 91%. DOE with STS transfer and pt requesting to sit down after ~2 seconds standing. SpO2 91% when checked seated at EOB again. Pt with significant decr activity tolerance asking to return to supine; with encouragement, sat 2 mins then return to supine on request, deferring further attempts OOB or activity EOB   Exercises     Shoulder Instructions      Home Living Family/patient expects to be discharged to:: Private residence Living Arrangements: Alone Available Help at Discharge: Family;Available PRN/intermittently Type of Home: Apartment Home Access: Level entry;Elevator     Home Layout: One level     Bathroom Shower/Tub: Chief Strategy Officer: Standard Bathroom Accessibility: Yes   Home Equipment: Agricultural consultant (2 wheels);Wheelchair - manual   Additional Comments: Pt reports being independent with his RW and his WC and  driving      Prior Functioning/Environment Prior Level of Function : Independent/Modified Independent             Mobility Comments: RW vs wheelchair ADLs Comments: independent in ADL; I can do it all    OT Problem List: Decreased strength;Decreased activity tolerance;Impaired balance (sitting and/or standing);Decreased safety awareness;Decreased knowledge of use of DME or AE;Decreased knowledge of precautions;Cardiopulmonary status limiting activity   OT Treatment/Interventions: Self-care/ADL training;Therapeutic exercise;Energy conservation;DME and/or AE instruction;Therapeutic activities;Balance training;Patient/family education      OT Goals(Current goals can be found in the care plan section)   Acute Rehab OT Goals Patient Stated Goal: get better OT Goal Formulation: With patient Time For Goal Achievement: 09/11/23 Potential to Achieve Goals: Good   OT Frequency:  Min 2X/week    Co-evaluation              AM-PAC OT 6 Clicks Daily Activity     Outcome Measure Help from another person eating meals?: A Little Help from another person taking care of personal grooming?: A Little Help from another person toileting, which includes using toliet, bedpan, or urinal?: A Lot Help from another person bathing (including washing, rinsing, drying)?: A Lot Help from another person to  put on and taking off regular upper body clothing?: A Little Help from another person to put on and taking off regular lower body clothing?: A Lot 6 Click Score: 15   End of Session Equipment Utilized During Treatment: Gait belt;Rolling walker (2 wheels);Oxygen  Nurse Communication: Mobility status  Activity Tolerance: Other (comment) (limited by deconditioning) Patient left: in bed;with call bell/phone within reach;with bed alarm set  OT Visit Diagnosis: Unsteadiness on feet (R26.81);Muscle weakness (generalized) (M62.81);Other (comment) (decr activity tolerance)                Time:  9189-9173 OT Time Calculation (min): 16 min Charges:  OT General Charges $OT Visit: 1 Visit OT Evaluation $OT Eval Low Complexity: 1 Low  Elma JONETTA Lebron FREDERICK, OTR/L [facility] Acute Rehabilitation Office: [phone]   Elma JONETTA Lebron 08/28/2023, 8:39 AM

## 2023-08-28 NOTE — Consult Note (Addendum)
  [facility] CONSULT NOTE  Patient Care Team: [name], [name], MD as PCP - General (Internal Medicine)  CHIEF COMPLAINTS/PURPOSE OF CONSULTATION:  Newly diagnosed lung adenocarcinoma  REFERRING PHYSICIAN: Dr. Davia  HISTORY OF PRESENTING ILLNESS:  Fred Wise 68 y.o. male who was admitted due to shortness of breath and had chest tube placed on 08/17/2023.  Subsequently presented for bronchoscopy with biopsy.  Pathology confirmed adenocarcinoma and therefore oncology evaluation has been requested. Patient is seen awake and alert laying in bed, status post enema by nursing staff a few minutes prior.  He is a very thin, cachectic male who served as principal historian for his medical history although he does not remember details.  Reports he was very short of breath at home and was coughing a lot so his family brought him to the hospital.  States he lives alone not very for from the hospital and takes care of himself.  Acknowledges weight loss, although unsure of how much weight he has lost and timeframe of weight loss. Denies hematuria or melena, nausea or vomiting.  Admits to constipation.  States he has a brother who is very active in his life and can be called for more information. Medical history includes hypertension and arthritis. Surgical history includes remote history of tonsillectomy and knee surgery. Oncologic and hematologic family history is denied.  Reports his mother is alive and well.  Father is deceased, unsure of reason. Social history significant for tobacco use, admits to starting when he was young, quit April 2025, 1 pack/week; although it is documented that patient had been smoking 1 pack a day for 30 to 40 years per patient's family.  Admits to alcohol use, quit many years ago.  Denies illicit drug use.  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  Denies occupational status material exposure.    I have reviewed his chart and materials  related to his cancer extensively and collaborated history with the patient. Summary of oncologic history is as follows: Oncology History   No history exists.    ASSESSMENT & PLAN:  RLL lung adenocarcinoma with hepatic mets - Newly diagnosed, TTF-1 positive - CT angio chest done 7/24 showed consolidation of entire RLL and RML as well as posterior of RUL. - CT head negative for abnormality. - Imaging shows no suspicious lytic or blastic osseous lesions. - Status post bronchoscopy with biopsy done 08/22/2023.  Path shows adenocarcinoma, poorly differentiated. -- Recommend MRI brain for staging -- Per pulmonary patient is not a surgical candidate due to severe debility and cachexia. --Consideration for Palliative for goals of care discussion - Medical oncology/Dr. Gatha will make further evaluation and treatment recommendations  Pleural effusion Shortness of breath Pneumonia History of COPD - Secondary to malignancy - Status post thoracentesis 7/25 - Status post chest tube placement 7/25, removed on 8/4. - Continue antibiotics as ordered - Continue supportive care  Failure to thrive Generalized weakness Malnutrition - Patient is thin and very cachectic.  He has obviously lost a lot of weight although he cannot estimate weight loss. - Dietitian following - Continue supportive care  Anemia, normocytic - Mild - Hemoglobin 11.8 - No transfusional intervention required at this time  Thrombocytosis - Likely reactive - Platelets increasing 648K - Continue to monitor CBC with differential    MEDICAL HISTORY:  Past Medical History:  Diagnosis Date   Arthritis    knees, uses wheelchair   Hypertension     SURGICAL HISTORY: Past Surgical  History:  Procedure Laterality Date   COLONOSCOPY  05/25/2011   KNEE SURGERY  01/23/1989   left   MULTIPLE TOOTH EXTRACTIONS     TONSILLECTOMY  01/24/1975   VIDEO BRONCHOSCOPY Right 08/22/2023   Procedure: BRONCHOSCOPY, WITH  FLUOROSCOPY;  Surgeon: Jude Harden GAILS, MD;  Location: [facility];  Service: Cardiopulmonary;  Laterality: Right;    SOCIAL HISTORY: Social History   Socioeconomic History   Marital status: Single    Spouse name: Not on file   Number of children: Not on file   Years of education: Not on file   Highest education level: Not on file  Occupational History   Not on file  Tobacco Use   Smoking status: Every Day    Current packs/day: 1.00    Types: Cigarettes   Smokeless tobacco: Never  Vaping Use   Vaping status: Never Used  Substance and Sexual Activity   Alcohol use: No   Drug use: No   Sexual activity: Not on file  Other Topics Concern   Not on file  Social History Narrative   Not on file   Social Drivers of Health   Financial Resource Strain: Not on file  Food Insecurity: Food Insecurity Present (08/17/2023)   Hunger Vital Sign    Worried About Running Out of Food in the Last Year: Sometimes true    Ran Out of Food in the Last Year: Sometimes true  Transportation Needs: Unknown (08/17/2023)   PRAPARE - Administrator, Civil Service (Medical): No    Lack of Transportation (Non-Medical): Not on file  Physical Activity: Not on file  Stress: Not on file  Social Connections: Socially Isolated (08/17/2023)   Social Connection and Isolation Panel    Frequency of Communication with Friends and Family: More than three times a week    Frequency of Social Gatherings with Friends and Family: Once a week    Attends Religious Services: Never    Database administrator or Organizations: No    Attends Banker Meetings: Never    Marital Status: Divorced  Catering manager Violence: Not At Risk (08/17/2023)   Humiliation, Afraid, Rape, and Kick questionnaire    Fear of Current or Ex-Partner: No    Emotionally Abused: No    Physically Abused: No    Sexually Abused: No    FAMILY HISTORY: Family History  Problem Relation Age of Onset   Colon cancer Neg Hx     Stomach cancer Neg Hx    Esophageal cancer Neg Hx    Rectal cancer Neg Hx      PHYSICAL EXAMINATION: ECOG PERFORMANCE STATUS: 3 - Symptomatic, >50% confined to bed  Vitals:   08/27/23 2033 08/28/23 0509  BP: 126/73 108/68  Pulse: (!) 110 (!) 111  Resp: 16 16  Temp: 98.1 F (36.7 C) (!) 97.5 F (36.4 C)  SpO2: 94% 93%   Filed Weights   08/16/23 1647 08/17/23 1152 08/20/23 1337  Weight: 97 lb (44 kg) 97 lb (44 kg) 105 lb 9.6 oz (47.9 kg)    GENERAL: alert,  +thin-appearing +cachectic SKIN: skin color, texture, turgor are normal, no rashes or significant lesions EYES: normal, conjunctiva are pink and non-injected, sclera clear OROPHARYNX: no exudate, no erythema and lips, buccal mucosa, and tongue normal  NECK: supple, thyroid normal size, non-tender, without nodularity LYMPH: no palpable lymphadenopathy in the cervical, axillary or inguinal LUNGS: +coarse to auscultation  HEART: regular rate & rhythm and no murmurs and no lower  extremity edema ABDOMEN: abdomen soft, non-tender and normal bowel sounds MUSCULOSKELETAL: no cyanosis of digits and no clubbing  PSYCH: alert & oriented x 3 with fluent speech NEURO: no focal motor/sensory deficits   ALLERGIES:  has no known allergies.  MEDICATIONS:  Current Facility-Administered Medications  Medication Dose Route Frequency Provider Last Rate Last Admin   acetaminophen  (TYLENOL ) tablet 1,000 mg  1,000 mg Oral Q8H Patel, Pranav M, MD   1,000 mg at 08/28/23 0559   enoxaparin  (LOVENOX ) injection 40 mg  40 mg Subcutaneous Q24H Claudene Reeves A, MD   40 mg at 08/28/23 1026   feeding supplement (ENSURE PLUS HIGH PROTEIN) liquid 237 mL  237 mL Oral BID BM Smith, Rondell A, MD   237 mL at 08/28/23 1033   guaiFENesin  (MUCINEX ) 12 hr tablet 600 mg  600 mg Oral BID Claudene Reeves A, MD   600 mg at 08/28/23 1026   ipratropium-albuterol  (DUONEB) 0.5-2.5 (3) MG/3ML nebulizer solution 3 mL  3 mL Nebulization Q4H PRN Adolph Tinnie BRAVO, PA-C        midodrine  (PROAMATINE ) tablet 5 mg  5 mg Oral TID WC Rai, Ripudeep K, MD   5 mg at 08/28/23 1300   morphine  (PF) 2 MG/ML injection 2 mg  2 mg Intravenous Q4H PRN Patel, Pranav M, MD   2 mg at 08/19/23 0530   Oral care mouth rinse  15 mL Mouth Rinse PRN Tobie Yetta HERO, MD       oxyCODONE  (Oxy IR/ROXICODONE ) immediate release tablet 5 mg  5 mg Oral Q4H PRN Patel, Pranav M, MD   5 mg at 08/27/23 2100   polyethylene glycol (MIRALAX  / GLYCOLAX ) packet 17 g  17 g Oral BID Rai, Ripudeep K, MD   17 g at 08/28/23 1026   senna-docusate (Senokot-S) tablet 1 tablet  1 tablet Oral BID Tobie Yetta HERO, MD   1 tablet at 08/28/23 1026   sodium chloride  flush (NS) 0.9 % injection 10 mL  10 mL Intrapleural Q8H Jude Donning V, MD   10 mL at 08/28/23 1033   sodium chloride  flush (NS) 0.9 % injection 3 mL  3 mL Intravenous Q12H Smith, Rondell A, MD   3 mL at 08/28/23 1032     LABORATORY DATA:  I have reviewed the data as listed Lab Results  Component Value Date   WBC 12.0 (H) 08/28/2023   HGB 11.8 (L) 08/28/2023   HCT 35.7 (L) 08/28/2023   MCV 85.2 08/28/2023   PLT 648 (H) 08/28/2023   Recent Labs    08/16/23 0538 08/17/23 0551 08/23/23 0636 08/26/23 0646 08/28/23 0631  NA 136   < > 134* 130* 130*  K 4.5   < > 5.0 4.4 4.4  CL 99   < > 99 95* 96*  CO2 23   < > 29 22 24   GLUCOSE 84   < > 104* 91 94  BUN 16   < > 13 15 16   CREATININE 0.75   < > 0.74 0.63 0.59*  CALCIUM 8.9   < > 8.9 9.1 9.0  GFRNONAA >60   < > >60 >60 >60  PROT 6.0*  --   --   --   --   ALBUMIN 2.1*  --   --   --  1.8*  AST 22  --   --   --   --   ALT 14  --   --   --   --   ALKPHOS 58  --   --   --   --  BILITOT 1.3*  --   --   --   --    < > = values in this interval not displayed.    RADIOGRAPHIC STUDIES: I have personally reviewed the radiological images as listed and agreed with the findings in the report. CT ABDOMEN PELVIS W CONTRAST Result Date: 08/28/2023 CLINICAL DATA:  Occult malignancy pneumothorax EXAM: CT  ABDOMEN AND PELVIS WITH CONTRAST TECHNIQUE: Multidetector CT imaging of the abdomen and pelvis was performed using the standard protocol following bolus administration of intravenous contrast. RADIATION DOSE REDUCTION: This exam was performed according to the departmental dose-optimization program which includes automated exposure control, adjustment of the mA and/or kV according to patient size and/or use of iterative reconstruction technique. CONTRAST:  75mL OMNIPAQUE  IOHEXOL  350 MG/ML SOLN COMPARISON:  CT chest 08/24/2023 FINDINGS: Lower chest: Persistent partially visualized right moderate volume hydropneumothorax. Redemonstration of right lung partial collapse with limited evaluation of the pulmonary parenchyma. Patchy left lower lobe consolidation. Associated left-to-right midline shift. Scattered left lung pulmonary nodules. Hepatobiliary: Scattered bilateral hepatic fluid dense lesions. No gallstones, gallbladder wall thickening, or pericholecystic fluid. No biliary dilatation. Pancreas: No focal lesion. Normal pancreatic contour. No surrounding inflammatory changes. No main pancreatic ductal dilatation. Spleen: Normal in size without focal abnormality. Adrenals/Urinary Tract: No adrenal nodule bilaterally. Bilateral kidneys enhance symmetrically. No hydronephrosis. No hydroureter. The urinary bladder is unremarkable. On delayed imaging, there is no urothelial wall thickening and there are no filling defects in the opacified portions of the bilateral collecting systems or ureters. Stomach/Bowel: Stomach is within normal limits. No evidence of bowel wall thickening or dilatation. Stool throughout the colon. Rectal stool ball measuring up to 8.5 cm. The appendix is not definitely identified with no inflammatory changes in the right lower quadrant to suggest acute appendicitis. Vascular/Lymphatic: No abdominal aorta or iliac aneurysm. Severe atherosclerotic plaque of the aorta and its branches. No abdominal,  pelvic, or inguinal lymphadenopathy. Reproductive: Prostate is unremarkable. Other: No intraperitoneal free fluid. No intraperitoneal free gas. No organized fluid collection. Musculoskeletal: No abdominal wall hernia or abnormality. No suspicious lytic or blastic osseous lesions. No acute displaced fracture. IMPRESSION: 1. Persistent partially visualized right moderate volume hydropneumothorax. Redemonstration of right lung partial collapse with limited evaluation of the pulmonary parenchyma. 2. Patchy left lower lobe consolidation. 3. Scattered left lung pulmonary nodules. 4. Constipation with rectal stool ball measuring up to 8.5 cm. 5.  Aortic Atherosclerosis (ICD10-I70.0). Electronically Signed   By: Morgane  Naveau M.D.   On: 08/28/2023 02:00   DG CHEST PORT 1 VIEW Result Date: 08/27/2023 EXAM: 1 VIEW XRAY OF THE CHEST 08/27/2023 05:45:13 AM COMPARISON: 08/26/2023 CLINICAL HISTORY: [id number] Chest tube in place [id number]. Right chest tube, hx pneumonia; rover FINDINGS: LUNGS AND PLEURA: Coarse airspace opacities right greater than left with relative sparing of the upper lobes, perhaps marginally improved from previous exam. Pigtail catheter remains laterally at the right lung base with persistent, perhaps slightly increased, moderately large effusion. Rightward mediastinal shift. HEART AND MEDIASTINUM: Rightward mediastinal shift. BONES AND SOFT TISSUES: No acute osseous abnormality. IMPRESSION: 1. Pigtail catheter in place at the right lung base with persistent, perhaps slightly increased, moderately large effusion. 2. Coarse airspace opacities, right greater than left, with relative sparing of the upper lobes, perhaps marginally improved from previous exam. 3. Rightward mediastinal shift. Electronically signed by: Katheleen Faes MD 08/27/2023 07:41 AM EDT RP Workstation: [id number]   DG CHEST PORT 1 VIEW Result Date: 08/26/2023 EXAM: 1 VIEW XRAY OF THE CHEST 08/26/2023 06:25:00 AM COMPARISON: AP  radiograph of  the chest dated 08/23/2023. CLINICAL HISTORY: [id number] Pneumothorax. Reason for exam: pt order states pneumothorax. FINDINGS: LUNGS AND PLEURA: Since the previous study, a right-sided pneumothorax has resolved. There is persistent hazy, streaky opacification of the right lung and elevation of the right hemidiaphragm. There is also right lateral pleural effusion/pleural thickening. There are coarse interstitial opacities present throughout the left mid and lower lung zones. HEART AND MEDIASTINUM: The heart is normal in size. BONES AND SOFT TISSUES: No acute osseous abnormality. IMPRESSION: 1. Resolved right-sided pneumothorax. 2. Persistent hazy, streaky opacification of the right lung, elevation of the right hemidiaphragm, and right lateral pleural effusion/thickening. 3. Coarse interstitial opacities in the left mid and lower lung zones. Electronically signed by: evalene coho 08/26/2023 08:05 AM EDT RP Workstation: [id number]   CT CHEST WO CONTRAST Result Date: 08/24/2023 CLINICAL DATA:  Pneumonia EXAM: CT CHEST WITHOUT CONTRAST TECHNIQUE: Multidetector CT imaging of the chest was performed following the standard protocol without IV contrast. RADIATION DOSE REDUCTION: This exam was performed according to the departmental dose-optimization program which includes automated exposure control, adjustment of the mA and/or kV according to patient size and/or use of iterative reconstruction technique. COMPARISON:  Chest radiograph August 23, 2023, chest CT August 16, 2023 FINDINGS: Cardiovascular: Cardiomediastinal shift to the right due to volume loss. Mediastinum/Nodes: Limited evaluation due mediastinal shift. Prominent lymph nodes for example in the left lower paratracheal up to 1.2 cm, similar to prior. Patulous esophagus containing retained secretions in lower part. Lungs/Pleura: Right-sided interval development of right-sided moderate hydropneumothorax, right apical intrapleural distance up to 1.5 cm. Right pleural  catheter tip in anterior pleural cavity. Persistent consolidations involving bilateral lower lobes right greater than left with associated bronchiectasis, and also right middle lobe with near complete occlusion of the bronchus intermedius and partial occlusion of the right upper lobe bronchus. There are innumerable bronchocentric solid and cavitary pulmonary nodules throughout both lungs measuring up to 8 mm, grossly stable to prior for example left upper lobe subpleural nodule measuring 8 mm image 4/82. Multiple foci of mucostasis. Diffuse bronchial and bronchiolar wall thickening. Moderate to severe centrilobular emphysematous changes. Small left-sided pleural effusion. Frothy secretions are identified in right main bronchus. Trachea is patent. Small right tracheal diverticulum in right posterolateral measuring 7 mm. Upper Abdomen: Multiple hypodense liver lesions, incompletely assessed on current noncontrast nondedicated CT. Musculoskeletal: Multilevel degenerative changes of the spine. IMPRESSION: Right sided moderate hydropneumothorax with the right and needs to have an pleural catheter in place. Trace pleural effusion on the left. Persistent consolidations involving the right middle and lower lobe and to lesser extent left lower lobe with severe volume loss and rightward mediastinal shift. Underlying malignancy cannot be excluded. Recommend follow-up to ensure resolution. Innumerable pulmonary nodules up to 8 mm, grossly stable and considered indeterminate. Metastasis cannot be excluded. Differentials include infection/inflammation (for example coccidiomycosis). Correlate with clinical findings and follow-up to ensure resolution. Centrilobular emphysematous changes. Multiple liver hypodense lesions incompletely assessed, likely representing cysts. Correlate with dedicated liver imaging. Electronically Signed   By: Megan  Zare M.D.   On: 08/24/2023 15:48   DG CHEST PORT 1 VIEW Result Date:  08/23/2023 CLINICAL DATA:  68 year old male with abnormal right lung. EXAM: PORTABLE CHEST 1 VIEW COMPARISON:  Chest CT 08/16/2023. FINDINGS: Portable AP upright view at 0815 hours. Ongoing dense plea opacified right mid and lower lung. Right pleural drain remains in place, new from prior CTA and stable since yesterday. Right lung ventilation only modestly improved compared to CTA chest  scout view 08/16/2023. Right pneumothorax is small to moderate, stable since yesterday and probably related to poor expansion of the abnormal right lung. Stable contralateral left lung with emphysema and less pronounced left lower lobe airspace opacity. Paucity of bowel gas.  Stable visualized osseous structures. IMPRESSION: 1. Stable since yesterday. Right pleural drain with small to moderate pneumothorax likely due to poor re-expansion of abnormal right lung. Right lung ventilation only modestly improved from abnormal Chest CTA 08/16/2023 (please see that report). 2. Stable left lower lobe pneumonia. 3. No new cardiopulmonary abnormality. Electronically Signed   By: VEAR Hurst M.D.   On: 08/23/2023 10:41   DG Chest Port 1 View Result Date: 08/22/2023 CLINICAL DATA:  Status post bronchoscopy EXAM: PORTABLE CHEST 1 VIEW COMPARISON:  X-ray 08/22/2023. FINDINGS: Extensive opacity of throughout the mid to lower right hemithorax with some volume loss and shift of the mediastinum from left-to-right. There is a pigtail catheter along the right lung base with a small right apical pneumothorax, similar to previous. Component pleural effusion is possible. Persistent interstitial and parenchymal opacity in left mid lower lung, slightly improved from previous. No left-sided pneumothorax or effusion. Normal cardiopericardial silhouette. Overlapping cardiac leads. IMPRESSION: Slight decrease in opacity at the left lung base with significant residual. Persistent diffuse right lung opacity with pneumothorax and pleural effusion as well as a pigtail  catheter. Volume loss. Recommend follow-up. Electronically Signed   By: Ranell Bring M.D.   On: 08/22/2023 13:19   DG C-Arm 1-60 Min-No Report Result Date: 08/22/2023 Fluoroscopy was utilized by the requesting physician.  No radiographic interpretation.   DG CHEST PORT 1 VIEW Result Date: 08/22/2023 CLINICAL DATA:  [id number] Pleural effusion 142230. EXAM: PORTABLE CHEST 1 VIEW COMPARISON:  08/21/2023. FINDINGS: Redemonstration of right apicolateral small pneumothorax without significant interval change. Stable positioning of the right pleural drainage catheter. Redemonstration of diffuse heterogeneous alveolar and interstitial opacities throughout bilateral lungs with relative sparing of the left upper mid lung zones. Findings are nonspecific and differential diagnosis includes multi lobar pneumonia, ARDS or pulmonary edema. No left pneumothorax Persistent small to moderate right pleural effusion. No significant left pleural effusion. Evaluation of cardiomediastinal silhouette is nondiagnostic due to right lower hemithorax opacification. However, overall significant interval change since the prior study. No acute osseous abnormalities. The soft tissues are within normal limits. IMPRESSION: No significant interval change since the prior study. Electronically Signed   By: Ree Molt M.D.   On: 08/22/2023 08:49   DG Chest Port 1 View Result Date: 08/21/2023 CLINICAL DATA:  Pleural effusion EXAM: PORTABLE CHEST 1 VIEW COMPARISON:  08/20/2023 FINDINGS: Single frontal view of the chest demonstrates right-sided pigtail drainage catheter overlying the right costophrenic angle. Since the prior exam, there is decreased gas component of the right-sided hydropneumothorax, with increasing fluid within the lower right hemithorax. No mediastinal shift or tension effect. Patchy bibasilar airspace disease again noted unchanged. Cardiac silhouette is stable. No acute bony abnormalities. IMPRESSION: 1. Persistent right-sided  hydropneumothorax, with stable position of the indwelling chest tube. Decreased gaseous component and increasing fluid component since prior study. No tension effect or midline shift. 2. Persistent basilar predominant airspace disease. Electronically Signed   By: Ozell Daring M.D.   On: 08/21/2023 12:33   DG CHEST PORT 1 VIEW Result Date: 08/20/2023 CLINICAL DATA:  Chest tube in place. EXAM: PORTABLE CHEST 1 VIEW COMPARISON:  08/19/2023. FINDINGS: Right-sided chest tube in similar position with persistent right hydropneumothorax, not significantly changed since prior exam. Slightly improved aeration of  the right upper lung and left mid to lower lung zones with persistent interstitial opacities. Cardiac silhouette is unchanged. IMPRESSION: 1. Right-sided chest tube in similar position with persistent right hydropneumothorax, not significantly changed. 2. Slightly improved aeration of the right upper lung and left mid to lower lung zones with persistent interstitial opacities. Electronically Signed   By: Harrietta Sherry M.D.   On: 08/20/2023 10:56   DG CHEST PORT 1 VIEW Result Date: 08/19/2023 CLINICAL DATA:  Chest tube in place. EXAM: PORTABLE CHEST 1 VIEW COMPARISON:  Chest radiograph dated 08/18/2023. FINDINGS: Right-sided chest tube in similar position. No significant interval change in the right hydropneumothorax. Left mid to lower lung field interstitial densities. Stable cardiac silhouette. No acute osseous pathology. IMPRESSION: No significant interval change in the right hydropneumothorax. Right-sided chest tube in similar position. Electronically Signed   By: Vanetta Chou M.D.   On: 08/19/2023 11:06   DG Swallowing Func-Speech Pathology Result Date: 08/18/2023 Table formatting from the original result was not included. Modified Barium Swallow Study Patient Details Name: Fred Wise MRN: [id number] Date of Birth: 18-Oct-1955 Today's Date: 08/18/2023 HPI/PMH: HPI: Pt is a 68 yr old male who  presented by EMS from home to [facility] emergency department for shortness of breath, which has been going on for about over a week per her report. CT Chest 7/24: 2. Consolidation of the entire right lower lobe and right middle  lobe, as well as much of the posterior portion of the right upper  lobe, with associated air bronchograms. Consolidation and scattered  nodularity along with varicoid bronchiectasis in the left lower  lobe. Some of the areas of nodularity are probably areas of cystic  bronchiectasis. Appearance compatible with multilobar pneumonia.  Strictly speaking a right lung mass particularly in the right lower  lobe cannot be excluded given the degree airspace opacity and airway  truncation.  3. Scattered nodules in the left lung. Given the complex and  extensive nature findings in this case, I recommend followup PA and  lateral chest radiography in 4 weeks time after appropriate  antibiotic therapy to reassess the lungs, and consideration of  noncontrast chest CT or PET-CT in 1-3 months to reassess the  underlying nodularity. Pt with pmhx significant of COPD, HTN, arthritis, and tobacco abuse (quit smoking 3 months ago). Clinical Impression: Clinical Impression: Patient presents with a primary pharyngoesophageal dysphagia as per this MBS. Swallows were initated at level of vallecular sinus for majority of PO's, however with straw sips of thin liquids, swallow initiated at pyriform sinus. Anterior hyoid excursion and laryngeal elevation were all partial in completion and laryngeal vestibule closure was incomplete. PES distention and duration was partial in completion, leading to moderate amount of pyriform sinus residuals s/p initial swallows. In addition, prominent cricopharyngeal bar was observed (no radiologist present to confirm) which resulted in reduced flow of barium. Patient was not sensate to pharyngeal residuals but subsequent swallows did help to clear some but not all of residuals.  Penetration of thin liquid barium occured above level of vocal cords which did not consistently clear vestibule (PAS 3, 2) and penetration above vocal cords that did clear laryngeal vestibule (PAS 2) occured with nectar thick liquids. During esophageal sweep, slow movement of barium through distal esophagus as well as retrograde flow of barium below PES observed. He exibited frequent belching thoughout this study. SLP recommending PO diet of Dys 2 (minced solids),  thin liquids, swallow 2-3 times with solids and liquids, follow GERD precautions and no  straws. Factors that may increase risk of adverse event in presence of aspiration Noe & Lianne 2021): Factors that may increase risk of adverse event in presence of aspiration Noe & Lianne 2021): Poor general health and/or compromised immunity; Frail or deconditioned Recommendations/Plan: Swallowing Evaluation Recommendations Swallowing Evaluation Recommendations Recommendations: PO diet PO Diet Recommendation: Dysphagia 2 (Finely chopped); Thin liquids (Level 0) Liquid Administration via: Cup; No straw Medication Administration: Whole meds with puree Supervision: Patient able to self-feed; Intermittent supervision/cueing for swallowing strategies Swallowing strategies  : Slow rate; Small bites/sips; Follow solids with liquids; Multiple dry swallows after each bite/sip Postural changes: Stay upright 30-60 min after meals; Position pt fully upright for meals Oral care recommendations: Oral care BID (2x/day) Treatment Plan Treatment Plan Treatment recommendations: Therapy as outlined in treatment plan below Follow-up recommendations: Other (comment) (SLP at next venue of care) Functional status assessment: Patient has had a recent decline in their functional status and demonstrates the ability to make significant improvements in function in a reasonable and predictable amount of time. Treatment frequency: Min 2x/week Treatment duration: 1 week Interventions:  Diet toleration management by SLP; Aspiration precaution training; Patient/family education; Trials of upgraded texture/liquids; Compensatory techniques Recommendations Recommendations for follow up therapy are one component of a multi-disciplinary discharge planning process, led by the attending physician.  Recommendations may be updated based on patient status, additional functional criteria and insurance authorization. Assessment: Orofacial Exam: Orofacial Exam Oral Cavity: Oral Hygiene: WFL Oral Cavity - Dentition: Dentures, top; Dentures, bottom Orofacial Anatomy: WFL Anatomy: Anatomy: Prominent cricopharyngeus Boluses Administered: Boluses Administered Boluses Administered: Thin liquids (Level 0); Mildly thick liquids (Level 2, nectar thick); Moderately thick liquids (Level 3, honey thick); Puree; Solid  Oral Impairment Domain: Oral Impairment Domain Lip Closure: No labial escape Tongue control during bolus hold: Not tested Bolus preparation/mastication: Timely and efficient chewing and mashing Bolus transport/lingual motion: Brisk tongue motion Oral residue: Complete oral clearance Location of oral residue : N/A Initiation of pharyngeal swallow : Valleculae  Pharyngeal Impairment Domain: Pharyngeal Impairment Domain Soft palate elevation: No bolus between soft palate (SP)/pharyngeal wall (PW) Laryngeal elevation: Partial superior movement of thyroid cartilage/partial approximation of arytenoids to epiglottic petiole Anterior hyoid excursion: Partial anterior movement Epiglottic movement: Complete inversion Laryngeal vestibule closure: Incomplete, narrow column air/contrast in laryngeal vestibule Pharyngeal stripping wave : Present - complete Pharyngeal contraction (A/P view only): N/A Pharyngoesophageal segment opening: Minimal distention/minimal duration, marked obstruction of flow Tongue base retraction: No contrast between tongue base and posterior pharyngeal wall (PPW) Pharyngeal residue: Collection of  residue within or on pharyngeal structures Location of pharyngeal residue: Pyriform sinuses; Valleculae; Tongue base; Aryepiglottic folds; Diffuse (>3 areas)  Esophageal Impairment Domain: Esophageal Impairment Domain Esophageal clearance upright position: Esophageal retention; Esophageal retention with retrograde flow below pharyngoesophageal segment (PES) Pill: No data recorded Penetration/Aspiration Scale Score: Penetration/Aspiration Scale Score 1.  Material does not enter airway: Moderately thick liquids (Level 3, honey thick); Puree; Solid 2.  Material enters airway, remains ABOVE vocal cords then ejected out: Mildly thick liquids (Level 2, nectar thick); Thin liquids (Level 0) 3.  Material enters airway, remains ABOVE vocal cords and not ejected out: Thin liquids (Level 0) Compensatory Strategies: Compensatory Strategies Compensatory strategies: Yes Straw: Ineffective Ineffective Straw: Thin liquid (Level 0)   General Information: No data recorded Diet Prior to this Study: Dysphagia 1 (pureed); Mildly thick liquids (Level 2, nectar thick)   Temperature : Normal   Respiratory Status: WFL   Supplemental O2: None (Room air)   History of Recent  Intubation: No  Behavior/Cognition: Alert; Cooperative; Pleasant mood Self-Feeding Abilities: Able to self-feed Baseline vocal quality/speech: Normal Volitional Cough: Able to elicit Volitional Swallow: Able to elicit Exam Limitations: No limitations Goal Planning: Prognosis for improved oropharyngeal function: Good No data recorded No data recorded Patient/Family Stated Goal: not stated Consulted and agree with results and recommendations: Patient Pain: Pain Assessment Pain Assessment: No/denies pain Pain Score: 0 Faces Pain Scale: 0 End of Session: Start Time:SLP Start Time (ACUTE ONLY): 1245 Stop Time: SLP Stop Time (ACUTE ONLY): 1305 Time Calculation:SLP Time Calculation (min) (ACUTE ONLY): 20 min Charges: SLP Evaluations $ SLP Speech Visit: 1 Visit SLP Evaluations  $BSS Swallow: 1 Procedure $MBS Swallow: 1 Procedure SLP visit diagnosis: SLP Visit Diagnosis: Dysphagia, pharyngoesophageal phase (R13.14) Past Medical History: Past Medical History: Diagnosis Date  Arthritis   knees, uses wheelchair  Hypertension  Past Surgical History: Past Surgical History: Procedure Laterality Date  COLONOSCOPY  05/25/2011  KNEE SURGERY  01/23/1989  left  MULTIPLE TOOTH EXTRACTIONS    TONSILLECTOMY  01/24/1975 Norleen IVAR Blase, MA, CCC-SLP Speech Therapy   DG CHEST PORT 1 VIEW Result Date: 08/18/2023 CLINICAL DATA:  Status post chest tube placement. EXAM: PORTABLE CHEST 1 VIEW COMPARISON:  Chest radiograph dated 08/16/2023. FINDINGS: Interval placement of a right-sided chest tube with tip over the lower lung field. Interval decrease in the size of the right pleural effusion and development of a moderate right pneumothorax. Similar appearance of diffuse interstitial coarsening and left lung base interstitial densities. Stable cardiac silhouette. No acute osseous pathology. IMPRESSION: Interval placement of a right-sided chest tube with decrease in the size of the right pleural effusion and development of a moderate right pneumothorax. These results will be called to the ordering clinician or representative by the Radiologist Assistant, and communication documented in the PACS or [employer]. Electronically Signed   By: Vanetta Chou M.D.   On: 08/18/2023 12:34   IR IMAGE GUIDED DRAINAGE BY PERCUTANEOUS CATHETER Result Date: 08/17/2023 INDICATION: Right-sided loculated pleural effusion EXAM: Chest tube placement under ultrasound and fluoroscopy MEDICATIONS: The patient is currently admitted to the hospital and receiving intravenous antibiotics. The antibiotics were administered within an appropriate time frame prior to the initiation of the procedure. ANESTHESIA/SEDATION: Moderate (conscious) sedation was employed during this procedure. A total of Versed  2 mg and Fentanyl  100 mcg was  administered intravenously by the radiology nurse. Total intra-service moderate Sedation Time: 10 minutes. The patient's level of consciousness and vital signs were monitored continuously by radiology nursing throughout the procedure under my direct supervision. COMPLICATIONS: None immediate. PROCEDURE: Informed written consent was obtained from the patient after a thorough discussion of the procedural risks, benefits and alternatives. All questions were addressed. Maximal Sterile Barrier Technique was utilized including caps, mask, sterile gowns, sterile gloves, sterile drape, hand hygiene and skin antiseptic. A timeout was performed prior to the initiation of the procedure. With patient in a left lateral decubitus position, the right upper quadrant was prepped and draped in usual sterile fashion. Ultrasound demonstrated the large loculated effusion to be accessible through a right axillary approach. Subcutaneous tissue was infiltrated 1% lidocaine . The Yueh needle was then advanced through a small incision from the skin through the intercostal region on the right into the pleural space. The needle was retrieved and a short Amplatz wire was then advanced under fluoroscopy into the pleural cavity. The access site was then dilated with a 10 [place] dilator. A 10 French pigtail drainage catheter was then advanced over the guidewire and  coiled within the pleural space. Retention suture applied. Sterile dressing applied. The catheter was then connected to 20 cm suction. IMPRESSION: Satisfactory placement 10 French right-sided drainage catheter into the pleural space. Catheter is connected to 20 cm suction. The patient will be placed on water  seal for transport. Electronically Signed   By: Cordella Banner   On: 08/17/2023 16:44   CT HEAD WO CONTRAST ( ) Result Date: 08/16/2023 CLINICAL DATA:  Memory loss of EXAM: CT HEAD WITHOUT CONTRAST TECHNIQUE: Contiguous axial images were obtained from the base of the skull  through the vertex without intravenous contrast. RADIATION DOSE REDUCTION: This exam was performed according to the departmental dose-optimization program which includes automated exposure control, adjustment of the mA and/or kV according to patient size and/or use of iterative reconstruction technique. COMPARISON:  None Available. FINDINGS: Brain: No evidence of acute infarction, hemorrhage, hydrocephalus, extra-axial collection or mass lesion/mass effect. Vascular: Calcific atherosclerosis.  No hyperdense vessel. Skull: No acute fracture. Sinuses/Orbits: Mostly clear sinuses. Left frontal sinus osteoma. No acute orbital findings. Other: No mastoid effusions. IMPRESSION: No evidence of acute intracranial abnormality. Electronically Signed   By: Gilmore GORMAN Molt M.D.   On: 08/16/2023 23:57   CT Angio Chest PE W and/or Wo Contrast Result Date: 08/16/2023 CLINICAL DATA:  Shortness of breath and extra tori wheezing EXAM: CT ANGIOGRAPHY CHEST WITH CONTRAST TECHNIQUE: Multidetector CT imaging of the chest was performed using the standard protocol during bolus administration of intravenous contrast. Multiplanar CT image reconstructions and MIPs were obtained to evaluate the vascular anatomy. RADIATION DOSE REDUCTION: This exam was performed according to the departmental dose-optimization program which includes automated exposure control, adjustment of the mA and/or kV according to patient size and/or use of iterative reconstruction technique. CONTRAST:  75mL OMNIPAQUE  IOHEXOL  350 MG/ML SOLN COMPARISON:  08/16/2023 chest radiograph FINDINGS: Cardiovascular: No filling defect is identified in the pulmonary arterial tree to suggest pulmonary embolus. Aortic and branch vessel atherosclerosis. Mediastinum/Nodes: Paucity of subcutaneous and mediastinal adipose tissues. Mildly enlarged left paratracheal lymph node 1.0 cm in short axis on image 68 series 4. Lungs/Pleura: Mucous causing fluid levels in both mainstem bronchi.  Complete occlusion of the right lower lobe bronchus. Plugging segmental bronchi in the left lower lobe. Consolidation of the entire right lower lobe and right middle lobe, as well as much of the posterior portion of the right upper lobe, with associated air bronchograms. Consolidation and scattered nodularity along with varicoid bronchiectasis in the left lower lobe. Some of the areas of nodularity are probably areas of cystic bronchiectasis. Scattered ill-defined nodules in the left lower lobe measure up to about 1 cm in the long axis. There is scattered small nodules in the left upper lobe, index nodule 7 by 5 mm on image 90 series 5. Background centrilobular emphysema. Moderate right pleural effusion is primarily sub pulmonic. Strictly speaking a right lung mass particularly in the right lower lobe cannot be excluded given the degree airspace opacity and airway truncation. Upper Abdomen: Scattered hypodense hepatic lesions once again favoring cysts, many similar to 05/01/2014. Abdominal aortic atherosclerosis. Nonspecific fluid density lesion of the right kidney upper pole on image 180 series 4 compatible with simple cyst. No further imaging workup of this lesion is indicated. Musculoskeletal: Thoracic kyphosis. Review of the MIP images confirms the above findings. IMPRESSION: 1. No filling defect is identified in the pulmonary arterial tree to suggest pulmonary embolus. 2. Consolidation of the entire right lower lobe and right middle lobe, as well as much of the posterior  portion of the right upper lobe, with associated air bronchograms. Consolidation and scattered nodularity along with varicoid bronchiectasis in the left lower lobe. Some of the areas of nodularity are probably areas of cystic bronchiectasis. Appearance compatible with multilobar pneumonia. Strictly speaking a right lung mass particularly in the right lower lobe cannot be excluded given the degree airspace opacity and airway truncation. 3.  Scattered nodules in the left lung. Given the complex and extensive nature findings in this case, I recommend followup PA and lateral chest radiography in 4 weeks time after appropriate antibiotic therapy to reassess the lungs, and consideration of noncontrast chest CT or PET-CT in 1-3 months to reassess the underlying nodularity. 4. Moderate right pleural effusion is primarily sub pulmonic. 5. Scattered hypodense hepatic lesions once again favoring cysts, much similar to 05/01/2014. 6. Thoracic kyphosis. 7. Aortic Atherosclerosis (ICD10-I70.0) and Emphysema (ICD10-J43.9). Electronically Signed   By: Ryan Salvage M.D.   On: 08/16/2023 08:38   DG Chest Portable 1 View Result Date: 08/16/2023 CLINICAL DATA:  Shortness of breath. EXAM: PORTABLE CHEST 1 VIEW COMPARISON:  02/16/2018 FINDINGS: Lungs are hyperexpanded. Dense consolidative disease is seen in the right mid and lower lung with some probable associated component of collapse. Associated right pleural effusion evident. There is patchy airspace disease at the left base. Interstitial markings are diffusely coarsened with chronic features. No acute bony abnormality. IMPRESSION: 1. Dense consolidative disease in the right mid and lower lung with some probable associated component of collapse. Imaging features compatible with pneumonia. Associated right pleural effusion. Consider chest CT with contrast to exclude central obstructing neoplasm. 2. Patchy airspace disease at the left base. Electronically Signed   By: Camellia Candle M.D.   On: 08/16/2023 06:56     The total time spent in the appointment was 55 minutes encounter with patients including review of chart and various tests results, discussions about plan of care and coordination of care plan   All questions were answered. The patient knows to call the clinic with any problems, questions or concerns. No barriers to learning was detected.  Olam JINNY Brunner, NP 8/5/20251:25 PM    ADDENDUM: Hematology/Oncology Attending: The patient is seen and examined today.  I agree with the above note. The patient is a 68 year old with lung cancer who presents with weakness and fatigue.  He initially sought medical attention due to weakness and fatigue, which led to a fall while taking his wheelchair out of the car.  He had Ct angiogram of the chest and it showed Consolidation of the entire right lower lobe and right middle lobe, as well as much of the posterior portion of the right upper lobe, with associated air bronchograms. Consolidation and scattered nodularity along with varicoid bronchiectasis in the left lower lobe. Some of the areas of nodularity are probably areas of cystic bronchiectasis. Appearance compatible with multilobar pneumonia. Strictly speaking a right lung mass particularly in the right lower lobe cannot be excluded given the degree airspace opacity and airway truncation. Scattered nodules in the left lung. Subsequent bronchoscopy revealed adenocarcinoma of the lung, confirming a diagnosis of lung cancer. cytology from the right pleural fluid was negative for malignancy but not conclusive.  He has experienced significant weight loss of about 30 to 40 pounds over the last six months. He has recently started using oxygen  therapy since being hospitalized. No nausea, vomiting, or chest pain, but he does report coughing up phlegm, which he attributes to pneumonia.  His medical history includes arthritis and  high blood pressure. 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 He has a history of smoking for about 40 years and quit in April 2025. He does not consume alcohol or use street drugs.  There is no family history of cancer, but his mother has high blood pressure. He is not married and has no children.  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 Cancer is present in both lungs with nodules on the left side and density on the lower part of the right lung. 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 No evidence of metastasis to the abdomen or pelvis. Awaiting MRI of the brain to rule out cerebral metastasis. He prefers treatment over hospice care. - Order MRI of the brain to check for metastasis - Schedule PET scan to assess for additional metastasis - Arrange follow-up appointment at the cancer center for treatment planning  Pneumonia Suspected due to symptoms of cough with phlegm and findings of lung density. Currently receiving treatment in the hospital. - Continue current treatment for pneumonia in the hospital  Chronic obstructive pulmonary disease (COPD) Currently requiring oxygen  therapy initiated during this hospital stay. No prior home oxygen  use reported. - Continue oxygen  therapy as needed  Unintentional weight loss Reported significant weight loss of 30-40 pounds over the last six months. Thank you for allowing me to participate in the care of Mr. Seeling.  I will continue to follow-up the patient with you and assist in his management on as-needed basis. Disclaimer: This note was dictated with voice recognition software. Similar sounding words can inadvertently be transcribed and may be missed upon review. Sherrod MARLA Sherrod, MD

## 2023-08-28 NOTE — Plan of Care (Signed)
  Problem: Coping: Goal: Level of anxiety will decrease Outcome: Progressing   Problem: Elimination: Goal: Will not experience complications related to bowel motility Outcome: Progressing Goal: Will not experience complications related to urinary retention Outcome: Progressing   Problem: Safety: Goal: Ability to remain free from injury will improve Outcome: Progressing   Problem: Skin Integrity: Goal: Risk for impaired skin integrity will decrease Outcome: Progressing   

## 2023-08-28 NOTE — TOC Progression Note (Signed)
 Transition of Care [facility]) - Progression Note    Patient Details  Name: Fred Wise MRN: [id number] Date of Birth: [birth year]  [facility]) CM/SW Contact  Lendia Dais, [place] Phone Number: 08/28/2023, 2:42 PM  Clinical Narrative:   CSW spoke to patient about PT rec's. Patient sataed that he wanted to return to a previous nursing facilities [facility].   CSW offered medicare choice list and the patient stated that [facility] was the previous facility that he went to and wanted his referral only to be sent only to [facility] for now.  Pt gave CSW permission to contact his brother [name].   CSW will send referral to [facility].    Expected Discharge Plan: Skilled Nursing Facility Barriers to Discharge: Continued Medical Work up               Expected Discharge Plan and Services In-house Referral: Clinical Social Work Discharge Planning Services: CM Consult Post Acute Care Choice: Skilled Nursing Facility Living arrangements for the past 2 months: Apartment                           HH Arranged: Charity fundraiser, PT, Nurse's Aide, Social Work [employer] Agency: [facility] Date HH Agency Contacted: 08/27/23 Time HH Agency Contacted: 1100 Representative spoke with at [facility] Agency: [place]   Social Drivers of Health (SDOH) Interventions SDOH Screenings   Food Insecurity: Food Insecurity Present (08/17/2023)  Housing: Low Risk  (08/17/2023)  Transportation Needs: Unknown (08/17/2023)  Utilities: Not At Risk (08/17/2023)  Social Connections: Socially Isolated (08/17/2023)  Tobacco Use: High Risk (08/22/2023)    Readmission Risk Interventions     No data to display

## 2023-08-29 DIAGNOSIS — J189 Pneumonia, unspecified organism: Secondary | ICD-10-CM | POA: Diagnosis not present

## 2023-08-29 LAB — RENAL FUNCTION PANEL
Albumin: 1.9 g/dL — ABNORMAL LOW (ref 3.5–5.0)
Anion gap: 10 (ref 5–15)
BUN: 17 mg/dL (ref 8–23)
CO2: 24 mmol/L (ref 22–32)
Calcium: 9.2 mg/dL (ref 8.9–10.3)
Chloride: 96 mmol/L — ABNORMAL LOW (ref 98–111)
Creatinine, Ser: 0.72 mg/dL (ref 0.61–1.24)
GFR, Estimated: >60 mL/min (ref >60)
Glucose, Bld: 94 mg/dL (ref 70–99)
Phosphorus: 3.6 mg/dL (ref 2.5–4.6)
Potassium: 4.9 mmol/L (ref 3.5–5.1)
Sodium: 130 mmol/L — ABNORMAL LOW (ref 135–145)

## 2023-08-29 LAB — CBC
HCT: 36.2 % — ABNORMAL LOW (ref 39.0–52.0)
Hemoglobin: 11.9 g/dL — ABNORMAL LOW (ref 13.0–17.0)
MCH: 28 pg (ref 26.0–34.0)
MCHC: 32.9 g/dL (ref 30.0–36.0)
MCV: 85.2 fL (ref 80.0–100.0)
Platelets: 735 K/uL — ABNORMAL HIGH (ref 150–400)
RBC: 4.25 MIL/uL (ref 4.22–5.81)
RDW: 16 % — ABNORMAL HIGH (ref 11.5–15.5)
WBC: 11.9 K/uL — ABNORMAL HIGH (ref 4.0–10.5)
nRBC: 0 % (ref 0.0–0.2)

## 2023-08-29 MED ORDER — IPRATROPIUM-ALBUTEROL 0.5-2.5 (3) MG/3ML IN SOLN: 3.0000 mL | RESPIRATORY_TRACT | Status: DC | PRN

## 2023-08-29 MED ORDER — MIDODRINE HCL 5 MG PO TABS: 5.0000 mg | ORAL_TABLET | Freq: Three times a day (TID) | ORAL | Status: DC

## 2023-08-29 MED ORDER — BISACODYL 10 MG RE SUPP: 10.0000 mg | Freq: Once | RECTAL | Status: DC

## 2023-08-29 MED ORDER — ENSURE PLUS HIGH PROTEIN PO LIQD: 237.0000 mL | Freq: Two times a day (BID) | ORAL | Status: DC

## 2023-08-29 MED ORDER — GUAIFENESIN ER 600 MG PO TB12: 600.0000 mg | ORAL_TABLET | Freq: Two times a day (BID) | ORAL | Status: DC

## 2023-08-29 MED ORDER — BISACODYL 10 MG RE SUPP: 10.0000 mg | Freq: Every day | RECTAL | Status: DC | PRN

## 2023-08-29 MED ORDER — ACETAMINOPHEN 500 MG PO TABS: 1000.0000 mg | ORAL_TABLET | Freq: Three times a day (TID) | ORAL | Status: DC

## 2023-08-29 MED ORDER — SENNOSIDES-DOCUSATE SODIUM 8.6-50 MG PO TABS: 1.0000 | ORAL_TABLET | Freq: Two times a day (BID) | ORAL | Status: DC

## 2023-08-29 NOTE — Progress Notes (Signed)
   08/29/23 1532  Spiritual Encounters  Type of Visit Attempt (pt unavailable)   Chaplain went to visit PT, however Pt was not in the room at the time of visit. Per nurse Pt has been discharge today.

## 2023-08-29 NOTE — Discharge Summary (Signed)
 Physician Discharge Summary   Patient: Fred Wise MRN: [id number] DOB: 31-Dec-1955  Admit date:     08/16/2023  Discharge date: 08/29/23  Discharge Physician: Yetta Blanch  PCP: Marlana [name], MD  Recommendations at discharge: Follow up with PCP in 1 week  Follow up with Oncology as recommended  Follow up with Palliative care as recommended to establish care.   Follow-up Information     [name], [name], MD. Schedule an appointment as soon as possible for a visit in 1 week(s).   Specialty: Internal Medicine Contact information: [address] [place] [address] [phone]         [facility] - A Dept Of [place]. Schedule an appointment as soon as possible for a visit in 1 week(s).   Specialty: Oncology Contact information: [address] [place]  [address] [phone]        Pickenpack-Cousar, Fannie SAILOR, NP. Call.   Specialty: Hospice and Palliative Medicine Why: To Establish Care to discsus goals of care. Contact information: [address] [place] [address] [phone]                Discharge Diagnoses: Principal Problem:   Multifocal pneumonia Active Problems:   Pleural effusion   Leukocytosis   Acute encephalopathy   Weight loss   Thrombocytosis   Tobacco abuse   Protein-calorie malnutrition, severe (HCC)   Underweight (BMI < 18.5)  Hospital Course: Patient with PMH of HTN, smoker, failure to thrive, presented to the hospital with complaints of cough and shortness of breath. Found to have right lower lobe pneumonia as well as possible parapneumonic effusion. Pulmonary consulted. Underwent IR guided chest tube placement on 7/25. Fluid consistent with exudate lymph pred. Cytology 7/25 and 7/28 neg for malignant cell.  Underwent bronch done. RLL bronchus narrowing. TBBX done. 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   Effusion: Likely malignant.  Oncology was consulted and recommended outpt follow up.   Assessment and Plan: Right lower lobe pneumonia. Right pleural effusion with loculation. Possible lung mass. Post chest tube insertion pneumothorax Appreciate pulmonary consultation. Suspect most likely secondary to pneumonia versus.  Malignancy Receiving IV antibiotics. Underwent IR guided chest tube insertion on 7/25. LDH elevated suggesting exudative fluid. Fluid culture negative.  Cytology negative for any malignancy. Chest x-ray shows unchanged hydropneumothorax. Concerning for trapped lung. Pleural lytic therapy done by pulmonary on 7/26. 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 Effusion: Likely malignant.  Medical Oncology consulted, recommended PET scan and outpt follow up.   Dysphagia. SLP evaluation. MBS performed. Dysphagia 2 diet recommended.  Acute metabolic encephalopathy. Likely in the setting of poor p.o. intake as well as infection. Mentation improving.  No focal deficit.  Weight loss. Adult failure to thrive. Underweight. Severe protein calorie malnutrition. Body mass index is 12.12 kg/m.  Dietitian following. Will monitor. Placing the patient at high risk for poor outcome.  Former smoker. Quit 3 months ago. Monitor for now.  Goals of care conversation  Detailed discussion with the pt about his goals of care.  Pt does not remember what was his diagnosis given by Dr Sherrod not he remembers what was the treatment options given to him. I feel the pt does not have capacity to make medical decision.  His brother has been at bedside and pt has been deferring to him about his decisions. Brother  understands the gravity of the situation.  Would like to complete HCOPA documents for the pt.  Recommended to consider DNR DNI for the pt.  Recommended to place a referral to Palliative care which he agreed to      Consultants:  PCCM  Oncology   Procedures performed:  Bronchoscopy and biopsy  Chest tube placment   DISCHARGE MEDICATION: Allergies as of 08/29/2023   No Known Allergies      Medication List     STOP taking these medications    amLODipine  2.5 MG tablet Commonly known as: NORVASC    amoxicillin  500 MG capsule Commonly known as: AMOXIL        TAKE these medications    acetaminophen  500 MG tablet Commonly known as: TYLENOL  Take 2 tablets (1,000 mg total) by mouth every 8 (eight) hours.   albuterol  108 (90 Base) MCG/ACT inhaler Commonly known as: VENTOLIN  HFA Inhale 2 puffs into the lungs every 6 (six) hours as needed for wheezing or shortness of breath.   bisacodyl  10 MG suppository Commonly known as: DULCOLAX Place 1 suppository (10 mg total) rectally daily as needed for moderate constipation.   feeding supplement Liqd Take 237 mLs by mouth 2 (two) times daily between meals.   guaiFENesin  600 MG 12 hr tablet Commonly known as: MUCINEX  Take 1 tablet (600 mg total) by mouth 2 (two) times daily.   ipratropium-albuterol  0.5-2.5 (3) MG/3ML Soln Commonly known as: DUONEB Take 3 mLs by nebulization every 4 (four) hours as needed.   midodrine  5 MG tablet Commonly known as: PROAMATINE  Take 1 tablet (5 mg total) by mouth 3 (three) times daily with meals.   senna-docusate 8.6-50 MG tablet Commonly known as: Senokot-S Take 1 tablet by mouth 2 (two) times daily.       Disposition: SNF Diet recommendation: Dysphagia type 2 thin Liquid  Discharge Exam: Vitals:   08/28/23 2034 08/28/23 2235 08/29/23 0018 08/29/23 0620  BP: 122/82 120/73 107/79 105/76  Pulse: (!) 116 (!) 110 (!) 108 (!) 110  Resp: 19  17 18   Temp: 97.8 F (36.6 C) 98.9 F (37.2 C) 97.7 F (36.5 C) 98 F (36.7 C)  TempSrc: Oral Oral Oral Oral  SpO2: 93% 96% 95% 93%  Weight:      Height:       General: in Mild distress, No Rash Cardiovascular: S1 and S2 Present, No  Murmur Respiratory: increased respiratory effort, Bilateral Air entry present. Right basal Crackles, No wheezes Abdomen: Bowel Sound present, No tenderness Extremities: No edema Neuro: Alert and oriented to self and place, no new focal deficit   Filed Weights   08/16/23 1647 08/17/23 1152 08/20/23 1337  Weight: 44 kg 44 kg 47.9 kg   Condition at discharge: stable  The results of significant diagnostics from this hospitalization (including imaging, microbiology, ancillary and laboratory) are listed below for reference.   Imaging Studies: MR BRAIN W WO CONTRAST Result Date: 08/29/2023 CLINICAL DATA:  Non-small cell lung cancer (NSCLC), staging EXAM: MRI HEAD WITHOUT AND WITH CONTRAST TECHNIQUE: Multiplanar, multiecho pulse sequences of the brain and surrounding structures were obtained without and with intravenous contrast. CONTRAST:  4mL GADAVIST  GADOBUTROL  1 MMOL/ML IV SOLN COMPARISON:  CT head 08/16/2023. FINDINGS: Brain: No acute infarction, hemorrhage, hydrocephalus, extra-axial collection or mass lesion. No abnormal enhancement. Mildly motion limited study. Vascular: Major arterial flow voids are maintained at the skull base. Skull and upper cervical spine: Normal marrow signal. Sinuses/Orbits: Negative. Other: None. IMPRESSION: No evidence of acute intracranial abnormality or metastatic  disease. Electronically Signed   By: Gilmore GORMAN Molt M.D.   On: 08/29/2023 00:09   CT ABDOMEN PELVIS W CONTRAST Result Date: 08/28/2023 CLINICAL DATA:  Occult malignancy pneumothorax EXAM: CT ABDOMEN AND PELVIS WITH CONTRAST TECHNIQUE: Multidetector CT imaging of the abdomen and pelvis was performed using the standard protocol following bolus administration of intravenous contrast. RADIATION DOSE REDUCTION: This exam was performed according to the departmental dose-optimization program which includes automated exposure control, adjustment of the mA and/or kV according to patient size and/or use of iterative  reconstruction technique. CONTRAST:  75mL OMNIPAQUE  IOHEXOL  350 MG/ML SOLN COMPARISON:  CT chest 08/24/2023 FINDINGS: Lower chest: Persistent partially visualized right moderate volume hydropneumothorax. Redemonstration of right lung partial collapse with limited evaluation of the pulmonary parenchyma. Patchy left lower lobe consolidation. Associated left-to-right midline shift. Scattered left lung pulmonary nodules. Hepatobiliary: Scattered bilateral hepatic fluid dense lesions. No gallstones, gallbladder wall thickening, or pericholecystic fluid. No biliary dilatation. Pancreas: No focal lesion. Normal pancreatic contour. No surrounding inflammatory changes. No main pancreatic ductal dilatation. Spleen: Normal in size without focal abnormality. Adrenals/Urinary Tract: No adrenal nodule bilaterally. Bilateral kidneys enhance symmetrically. No hydronephrosis. No hydroureter. The urinary bladder is unremarkable. On delayed imaging, there is no urothelial wall thickening and there are no filling defects in the opacified portions of the bilateral collecting systems or ureters. Stomach/Bowel: Stomach is within normal limits. No evidence of bowel wall thickening or dilatation. Stool throughout the colon. Rectal stool ball measuring up to 8.5 cm. The appendix is not definitely identified with no inflammatory changes in the right lower quadrant to suggest acute appendicitis. Vascular/Lymphatic: No abdominal aorta or iliac aneurysm. Severe atherosclerotic plaque of the aorta and its branches. No abdominal, pelvic, or inguinal lymphadenopathy. Reproductive: Prostate is unremarkable. Other: No intraperitoneal free fluid. No intraperitoneal free gas. No organized fluid collection. Musculoskeletal: No abdominal wall hernia or abnormality. No suspicious lytic or blastic osseous lesions. No acute displaced fracture. IMPRESSION: 1. Persistent partially visualized right moderate volume hydropneumothorax. Redemonstration of right  lung partial collapse with limited evaluation of the pulmonary parenchyma. 2. Patchy left lower lobe consolidation. 3. Scattered left lung pulmonary nodules. 4. Constipation with rectal stool ball measuring up to 8.5 cm. 5.  Aortic Atherosclerosis (ICD10-I70.0). Electronically Signed   By: Morgane  Naveau M.D.   On: 08/28/2023 02:00   DG CHEST PORT 1 VIEW Result Date: 08/27/2023 EXAM: 1 VIEW XRAY OF THE CHEST 08/27/2023 05:45:13 AM COMPARISON: 08/26/2023 CLINICAL HISTORY: [id number] Chest tube in place [id number]. Right chest tube, hx pneumonia; rover FINDINGS: LUNGS AND PLEURA: Coarse airspace opacities right greater than left with relative sparing of the upper lobes, perhaps marginally improved from previous exam. Pigtail catheter remains laterally at the right lung base with persistent, perhaps slightly increased, moderately large effusion. Rightward mediastinal shift. HEART AND MEDIASTINUM: Rightward mediastinal shift. BONES AND SOFT TISSUES: No acute osseous abnormality. IMPRESSION: 1. Pigtail catheter in place at the right lung base with persistent, perhaps slightly increased, moderately large effusion. 2. Coarse airspace opacities, right greater than left, with relative sparing of the upper lobes, perhaps marginally improved from previous exam. 3. Rightward mediastinal shift. Electronically signed by: Dayne Hassell MD 08/27/2023 07:41 AM EDT RP Workstation: [id number]   DG CHEST PORT 1 VIEW Result Date: 08/26/2023 EXAM: 1 VIEW XRAY OF THE CHEST 08/26/2023 06:25:00 AM COMPARISON: AP radiograph of the chest dated 08/23/2023. CLINICAL HISTORY: [id number] Pneumothorax. Reason for exam: pt order states pneumothorax. FINDINGS: LUNGS AND PLEURA: Since the previous study, a right-sided pneumothorax has resolved.  There is persistent hazy, streaky opacification of the right lung and elevation of the right hemidiaphragm. There is also right lateral pleural effusion/pleural thickening. There are coarse interstitial opacities  present throughout the left mid and lower lung zones. HEART AND MEDIASTINUM: The heart is normal in size. BONES AND SOFT TISSUES: No acute osseous abnormality. IMPRESSION: 1. Resolved right-sided pneumothorax. 2. Persistent hazy, streaky opacification of the right lung, elevation of the right hemidiaphragm, and right lateral pleural effusion/thickening. 3. Coarse interstitial opacities in the left mid and lower lung zones. Electronically signed by: evalene coho 08/26/2023 08:05 AM EDT RP Workstation: [id number]   CT CHEST WO CONTRAST Result Date: 08/24/2023 CLINICAL DATA:  Pneumonia EXAM: CT CHEST WITHOUT CONTRAST TECHNIQUE: Multidetector CT imaging of the chest was performed following the standard protocol without IV contrast. RADIATION DOSE REDUCTION: This exam was performed according to the departmental dose-optimization program which includes automated exposure control, adjustment of the mA and/or kV according to patient size and/or use of iterative reconstruction technique. COMPARISON:  Chest radiograph August 23, 2023, chest CT August 16, 2023 FINDINGS: Cardiovascular: Cardiomediastinal shift to the right due to volume loss. Mediastinum/Nodes: Limited evaluation due mediastinal shift. Prominent lymph nodes for example in the left lower paratracheal up to 1.2 cm, similar to prior. Patulous esophagus containing retained secretions in lower part. Lungs/Pleura: Right-sided interval development of right-sided moderate hydropneumothorax, right apical intrapleural distance up to 1.5 cm. Right pleural catheter tip in anterior pleural cavity. Persistent consolidations involving bilateral lower lobes right greater than left with associated bronchiectasis, and also right middle lobe with near complete occlusion of the bronchus intermedius and partial occlusion of the right upper lobe bronchus. There are innumerable bronchocentric solid and cavitary pulmonary nodules throughout both lungs measuring up to 8 mm, grossly  stable to prior for example left upper lobe subpleural nodule measuring 8 mm image 4/82. Multiple foci of mucostasis. Diffuse bronchial and bronchiolar wall thickening. Moderate to severe centrilobular emphysematous changes. Small left-sided pleural effusion. Frothy secretions are identified in right main bronchus. Trachea is patent. Small right tracheal diverticulum in right posterolateral measuring 7 mm. Upper Abdomen: Multiple hypodense liver lesions, incompletely assessed on current noncontrast nondedicated CT. Musculoskeletal: Multilevel degenerative changes of the spine. IMPRESSION: Right sided moderate hydropneumothorax with the right and needs to have an pleural catheter in place. Trace pleural effusion on the left. Persistent consolidations involving the right middle and lower lobe and to lesser extent left lower lobe with severe volume loss and rightward mediastinal shift. Underlying malignancy cannot be excluded. Recommend follow-up to ensure resolution. Innumerable pulmonary nodules up to 8 mm, grossly stable and considered indeterminate. Metastasis cannot be excluded. Differentials include infection/inflammation (for example coccidiomycosis). Correlate with clinical findings and follow-up to ensure resolution. Centrilobular emphysematous changes. Multiple liver hypodense lesions incompletely assessed, likely representing cysts. Correlate with dedicated liver imaging. Electronically Signed   By: Megan  Zare M.D.   On: 08/24/2023 15:48   DG CHEST PORT 1 VIEW Result Date: 08/23/2023 CLINICAL DATA:  68 year old male with abnormal right lung. EXAM: PORTABLE CHEST 1 VIEW COMPARISON:  Chest CT 08/16/2023. FINDINGS: Portable AP upright view at 0815 hours. Ongoing dense plea opacified right mid and lower lung. Right pleural drain remains in place, new from prior CTA and stable since yesterday. Right lung ventilation only modestly improved compared to CTA chest scout view 08/16/2023. Right pneumothorax is  small to moderate, stable since yesterday and probably related to poor expansion of the abnormal right lung. Stable contralateral left lung with emphysema and  less pronounced left lower lobe airspace opacity. Paucity of bowel gas.  Stable visualized osseous structures. IMPRESSION: 1. Stable since yesterday. Right pleural drain with small to moderate pneumothorax likely due to poor re-expansion of abnormal right lung. Right lung ventilation only modestly improved from abnormal Chest CTA 08/16/2023 (please see that report). 2. Stable left lower lobe pneumonia. 3. No new cardiopulmonary abnormality. Electronically Signed   By: VEAR Hurst M.D.   On: 08/23/2023 10:41   DG Chest Port 1 View Result Date: 08/22/2023 CLINICAL DATA:  Status post bronchoscopy EXAM: PORTABLE CHEST 1 VIEW COMPARISON:  X-ray 08/22/2023. FINDINGS: Extensive opacity of throughout the mid to lower right hemithorax with some volume loss and shift of the mediastinum from left-to-right. There is a pigtail catheter along the right lung base with a small right apical pneumothorax, similar to previous. Component pleural effusion is possible. Persistent interstitial and parenchymal opacity in left mid lower lung, slightly improved from previous. No left-sided pneumothorax or effusion. Normal cardiopericardial silhouette. Overlapping cardiac leads. IMPRESSION: Slight decrease in opacity at the left lung base with significant residual. Persistent diffuse right lung opacity with pneumothorax and pleural effusion as well as a pigtail catheter. Volume loss. Recommend follow-up. Electronically Signed   By: Ranell Bring M.D.   On: 08/22/2023 13:19   DG C-Arm 1-60 Min-No Report Result Date: 08/22/2023 Fluoroscopy was utilized by the requesting physician.  No radiographic interpretation.   DG CHEST PORT 1 VIEW Result Date: 08/22/2023 CLINICAL DATA:  [id number] Pleural effusion 142230. EXAM: PORTABLE CHEST 1 VIEW COMPARISON:  08/21/2023. FINDINGS: Redemonstration  of right apicolateral small pneumothorax without significant interval change. Stable positioning of the right pleural drainage catheter. Redemonstration of diffuse heterogeneous alveolar and interstitial opacities throughout bilateral lungs with relative sparing of the left upper mid lung zones. Findings are nonspecific and differential diagnosis includes multi lobar pneumonia, ARDS or pulmonary edema. No left pneumothorax Persistent small to moderate right pleural effusion. No significant left pleural effusion. Evaluation of cardiomediastinal silhouette is nondiagnostic due to right lower hemithorax opacification. However, overall significant interval change since the prior study. No acute osseous abnormalities. The soft tissues are within normal limits. IMPRESSION: No significant interval change since the prior study. Electronically Signed   By: Ree Molt M.D.   On: 08/22/2023 08:49   DG Chest Port 1 View Result Date: 08/21/2023 CLINICAL DATA:  Pleural effusion EXAM: PORTABLE CHEST 1 VIEW COMPARISON:  08/20/2023 FINDINGS: Single frontal view of the chest demonstrates right-sided pigtail drainage catheter overlying the right costophrenic angle. Since the prior exam, there is decreased gas component of the right-sided hydropneumothorax, with increasing fluid within the lower right hemithorax. No mediastinal shift or tension effect. Patchy bibasilar airspace disease again noted unchanged. Cardiac silhouette is stable. No acute bony abnormalities. IMPRESSION: 1. Persistent right-sided hydropneumothorax, with stable position of the indwelling chest tube. Decreased gaseous component and increasing fluid component since prior study. No tension effect or midline shift. 2. Persistent basilar predominant airspace disease. Electronically Signed   By: Ozell Daring M.D.   On: 08/21/2023 12:33   DG CHEST PORT 1 VIEW Result Date: 08/20/2023 CLINICAL DATA:  Chest tube in place. EXAM: PORTABLE CHEST 1 VIEW COMPARISON:   08/19/2023. FINDINGS: Right-sided chest tube in similar position with persistent right hydropneumothorax, not significantly changed since prior exam. Slightly improved aeration of the right upper lung and left mid to lower lung zones with persistent interstitial opacities. Cardiac silhouette is unchanged. IMPRESSION: 1. Right-sided chest tube in similar position with persistent right  hydropneumothorax, not significantly changed. 2. Slightly improved aeration of the right upper lung and left mid to lower lung zones with persistent interstitial opacities. Electronically Signed   By: Harrietta Sherry M.D.   On: 08/20/2023 10:56   DG CHEST PORT 1 VIEW Result Date: 08/19/2023 CLINICAL DATA:  Chest tube in place. EXAM: PORTABLE CHEST 1 VIEW COMPARISON:  Chest radiograph dated 08/18/2023. FINDINGS: Right-sided chest tube in similar position. No significant interval change in the right hydropneumothorax. Left mid to lower lung field interstitial densities. Stable cardiac silhouette. No acute osseous pathology. IMPRESSION: No significant interval change in the right hydropneumothorax. Right-sided chest tube in similar position. Electronically Signed   By: Vanetta Chou M.D.   On: 08/19/2023 11:06   DG Swallowing Func-Speech Pathology Result Date: 08/18/2023 Table formatting from the original result was not included. Modified Barium Swallow Study Patient Details Name: Paarth Cropper MRN: [id number] Date of Birth: [birth year] Today's Date: 08/18/2023 HPI/PMH: HPI: Pt is a 68 yr old male who presented by EMS from home to [facility] emergency department for shortness of breath, which has been going on for about over a week per her report. CT Chest 7/24: 2. Consolidation of the entire right lower lobe and right middle  lobe, as well as much of the posterior portion of the right upper  lobe, with associated air bronchograms. Consolidation and scattered  nodularity along with varicoid bronchiectasis in the left lower  lobe.  Some of the areas of nodularity are probably areas of cystic  bronchiectasis. Appearance compatible with multilobar pneumonia.  Strictly speaking a right lung mass particularly in the right lower  lobe cannot be excluded given the degree airspace opacity and airway  truncation.  3. Scattered nodules in the left lung. Given the complex and  extensive nature findings in this case, I recommend followup PA and  lateral chest radiography in 4 weeks time after appropriate  antibiotic therapy to reassess the lungs, and consideration of  noncontrast chest CT or PET-CT in 1-3 months to reassess the  underlying nodularity. Pt with pmhx significant of COPD, HTN, arthritis, and tobacco abuse (quit smoking 3 months ago). Clinical Impression: Clinical Impression: Patient presents with a primary pharyngoesophageal dysphagia as per this MBS. Swallows were initated at level of vallecular sinus for majority of PO's, however with straw sips of thin liquids, swallow initiated at pyriform sinus. Anterior hyoid excursion and laryngeal elevation were all partial in completion and laryngeal vestibule closure was incomplete. PES distention and duration was partial in completion, leading to moderate amount of pyriform sinus residuals s/p initial swallows. In addition, prominent cricopharyngeal bar was observed (no radiologist present to confirm) which resulted in reduced flow of barium. Patient was not sensate to pharyngeal residuals but subsequent swallows did help to clear some but not all of residuals. Penetration of thin liquid barium occured above level of vocal cords which did not consistently clear vestibule (PAS 3, 2) and penetration above vocal cords that did clear laryngeal vestibule (PAS 2) occured with nectar thick liquids. During esophageal sweep, slow movement of barium through distal esophagus as well as retrograde flow of barium below PES observed. He exibited frequent belching thoughout this study. SLP recommending PO diet  of Dys 2 (minced solids),  thin liquids, swallow 2-3 times with solids and liquids, follow GERD precautions and no straws. Factors that may increase risk of adverse event in presence of aspiration Noe & Lianne 2021): Factors that may increase risk of adverse event in presence of aspiration Noe &  Lianne 2021): Poor general health and/or compromised immunity; Frail or deconditioned Recommendations/Plan: Swallowing Evaluation Recommendations Swallowing Evaluation Recommendations Recommendations: PO diet PO Diet Recommendation: Dysphagia 2 (Finely chopped); Thin liquids (Level 0) Liquid Administration via: Cup; No straw Medication Administration: Whole meds with puree Supervision: Patient able to self-feed; Intermittent supervision/cueing for swallowing strategies Swallowing strategies  : Slow rate; Small bites/sips; Follow solids with liquids; Multiple dry swallows after each bite/sip Postural changes: Stay upright 30-60 min after meals; Position pt fully upright for meals Oral care recommendations: Oral care BID (2x/day) Treatment Plan Treatment Plan Treatment recommendations: Therapy as outlined in treatment plan below Follow-up recommendations: Other (comment) (SLP at next venue of care) Functional status assessment: Patient has had a recent decline in their functional status and demonstrates the ability to make significant improvements in function in a reasonable and predictable amount of time. Treatment frequency: Min 2x/week Treatment duration: 1 week Interventions: Diet toleration management by SLP; Aspiration precaution training; Patient/family education; Trials of upgraded texture/liquids; Compensatory techniques Recommendations Recommendations for follow up therapy are one component of a multi-disciplinary discharge planning process, led by the attending physician.  Recommendations may be updated based on patient status, additional functional criteria and insurance authorization. Assessment:  Orofacial Exam: Orofacial Exam Oral Cavity: Oral Hygiene: WFL Oral Cavity - Dentition: Dentures, top; Dentures, bottom Orofacial Anatomy: WFL Anatomy: Anatomy: Prominent cricopharyngeus Boluses Administered: Boluses Administered Boluses Administered: Thin liquids (Level 0); Mildly thick liquids (Level 2, nectar thick); Moderately thick liquids (Level 3, honey thick); Puree; Solid  Oral Impairment Domain: Oral Impairment Domain Lip Closure: No labial escape Tongue control during bolus hold: Not tested Bolus preparation/mastication: Timely and efficient chewing and mashing Bolus transport/lingual motion: Brisk tongue motion Oral residue: Complete oral clearance Location of oral residue : N/A Initiation of pharyngeal swallow : Valleculae  Pharyngeal Impairment Domain: Pharyngeal Impairment Domain Soft palate elevation: No bolus between soft palate (SP)/pharyngeal wall (PW) Laryngeal elevation: Partial superior movement of thyroid cartilage/partial approximation of arytenoids to epiglottic petiole Anterior hyoid excursion: Partial anterior movement Epiglottic movement: Complete inversion Laryngeal vestibule closure: Incomplete, narrow column air/contrast in laryngeal vestibule Pharyngeal stripping wave : Present - complete Pharyngeal contraction (A/P view only): N/A Pharyngoesophageal segment opening: Minimal distention/minimal duration, marked obstruction of flow Tongue base retraction: No contrast between tongue base and posterior pharyngeal wall (PPW) Pharyngeal residue: Collection of residue within or on pharyngeal structures Location of pharyngeal residue: Pyriform sinuses; Valleculae; Tongue base; Aryepiglottic folds; Diffuse (>3 areas)  Esophageal Impairment Domain: Esophageal Impairment Domain Esophageal clearance upright position: Esophageal retention; Esophageal retention with retrograde flow below pharyngoesophageal segment (PES) Pill: No data recorded Penetration/Aspiration Scale Score:  Penetration/Aspiration Scale Score 1.  Material does not enter airway: Moderately thick liquids (Level 3, honey thick); Puree; Solid 2.  Material enters airway, remains ABOVE vocal cords then ejected out: Mildly thick liquids (Level 2, nectar thick); Thin liquids (Level 0) 3.  Material enters airway, remains ABOVE vocal cords and not ejected out: Thin liquids (Level 0) Compensatory Strategies: Compensatory Strategies Compensatory strategies: Yes Straw: Ineffective Ineffective Straw: Thin liquid (Level 0)   General Information: No data recorded Diet Prior to this Study: Dysphagia 1 (pureed); Mildly thick liquids (Level 2, nectar thick)   Temperature : Normal   Respiratory Status: WFL   Supplemental O2: None (Room air)   History of Recent Intubation: No  Behavior/Cognition: Alert; Cooperative; Pleasant mood Self-Feeding Abilities: Able to self-feed Baseline vocal quality/speech: Normal Volitional Cough: Able to elicit Volitional Swallow: Able to elicit Exam Limitations: No limitations  Goal Planning: Prognosis for improved oropharyngeal function: Good No data recorded No data recorded Patient/Family Stated Goal: not stated Consulted and agree with results and recommendations: Patient Pain: Pain Assessment Pain Assessment: No/denies pain Pain Score: 0 Faces Pain Scale: 0 End of Session: Start Time:SLP Start Time (ACUTE ONLY): 1245 Stop Time: SLP Stop Time (ACUTE ONLY): 1305 Time Calculation:SLP Time Calculation (min) (ACUTE ONLY): 20 min Charges: SLP Evaluations $ SLP Speech Visit: 1 Visit SLP Evaluations $BSS Swallow: 1 Procedure $MBS Swallow: 1 Procedure SLP visit diagnosis: SLP Visit Diagnosis: Dysphagia, pharyngoesophageal phase (R13.14) Past Medical History: Past Medical History: Diagnosis Date  Arthritis   knees, uses wheelchair  Hypertension  Past Surgical History: Past Surgical History: Procedure Laterality Date  COLONOSCOPY  05/25/2011  KNEE SURGERY  01/23/1989  left  MULTIPLE TOOTH EXTRACTIONS    TONSILLECTOMY   01/24/1975 Norleen IVAR Blase, MA, CCC-SLP Speech Therapy   DG CHEST PORT 1 VIEW Result Date: 08/18/2023 CLINICAL DATA:  Status post chest tube placement. EXAM: PORTABLE CHEST 1 VIEW COMPARISON:  Chest radiograph dated 08/16/2023. FINDINGS: Interval placement of a right-sided chest tube with tip over the lower lung field. Interval decrease in the size of the right pleural effusion and development of a moderate right pneumothorax. Similar appearance of diffuse interstitial coarsening and left lung base interstitial densities. Stable cardiac silhouette. No acute osseous pathology. IMPRESSION: Interval placement of a right-sided chest tube with decrease in the size of the right pleural effusion and development of a moderate right pneumothorax. These results will be called to the ordering clinician or representative by the Radiologist Assistant, and communication documented in the PACS or [employer]. Electronically Signed   By: Vanetta Chou M.D.   On: 08/18/2023 12:34   IR IMAGE GUIDED DRAINAGE BY PERCUTANEOUS CATHETER Result Date: 08/17/2023 INDICATION: Right-sided loculated pleural effusion EXAM: Chest tube placement under ultrasound and fluoroscopy MEDICATIONS: The patient is currently admitted to the hospital and receiving intravenous antibiotics. The antibiotics were administered within an appropriate time frame prior to the initiation of the procedure. ANESTHESIA/SEDATION: Moderate (conscious) sedation was employed during this procedure. A total of Versed  2 mg and Fentanyl  100 mcg was administered intravenously by the radiology nurse. Total intra-service moderate Sedation Time: 10 minutes. The patient's level of consciousness and vital signs were monitored continuously by radiology nursing throughout the procedure under my direct supervision. COMPLICATIONS: None immediate. PROCEDURE: Informed written consent was obtained from the patient after a thorough discussion of the procedural risks, benefits  and alternatives. All questions were addressed. Maximal Sterile Barrier Technique was utilized including caps, mask, sterile gowns, sterile gloves, sterile drape, hand hygiene and skin antiseptic. A timeout was performed prior to the initiation of the procedure. With patient in a left lateral decubitus position, the right upper quadrant was prepped and draped in usual sterile fashion. Ultrasound demonstrated the large loculated effusion to be accessible through a right axillary approach. Subcutaneous tissue was infiltrated 1% lidocaine . The Yueh needle was then advanced through a small incision from the skin through the intercostal region on the right into the pleural space. The needle was retrieved and a short Amplatz wire was then advanced under fluoroscopy into the pleural cavity. The access site was then dilated with a 10 [place] dilator. A 10 French pigtail drainage catheter was then advanced over the guidewire and coiled within the pleural space. Retention suture applied. Sterile dressing applied. The catheter was then connected to 20 cm suction. IMPRESSION: Satisfactory placement 10 French right-sided drainage catheter into the pleural  space. Catheter is connected to 20 cm suction. The patient will be placed on water  seal for transport. Electronically Signed   By: Cordella Banner   On: 08/17/2023 16:44   CT HEAD WO CONTRAST ( ) Result Date: 08/16/2023 CLINICAL DATA:  Memory loss of EXAM: CT HEAD WITHOUT CONTRAST TECHNIQUE: Contiguous axial images were obtained from the base of the skull through the vertex without intravenous contrast. RADIATION DOSE REDUCTION: This exam was performed according to the departmental dose-optimization program which includes automated exposure control, adjustment of the mA and/or kV according to patient size and/or use of iterative reconstruction technique. COMPARISON:  None Available. FINDINGS: Brain: No evidence of acute infarction, hemorrhage, hydrocephalus, extra-axial  collection or mass lesion/mass effect. Vascular: Calcific atherosclerosis.  No hyperdense vessel. Skull: No acute fracture. Sinuses/Orbits: Mostly clear sinuses. Left frontal sinus osteoma. No acute orbital findings. Other: No mastoid effusions. IMPRESSION: No evidence of acute intracranial abnormality. Electronically Signed   By: Gilmore GORMAN Molt M.D.   On: 08/16/2023 23:57   CT Angio Chest PE W and/or Wo Contrast Result Date: 08/16/2023 CLINICAL DATA:  Shortness of breath and extra tori wheezing EXAM: CT ANGIOGRAPHY CHEST WITH CONTRAST TECHNIQUE: Multidetector CT imaging of the chest was performed using the standard protocol during bolus administration of intravenous contrast. Multiplanar CT image reconstructions and MIPs were obtained to evaluate the vascular anatomy. RADIATION DOSE REDUCTION: This exam was performed according to the departmental dose-optimization program which includes automated exposure control, adjustment of the mA and/or kV according to patient size and/or use of iterative reconstruction technique. CONTRAST:  75mL OMNIPAQUE  IOHEXOL  350 MG/ML SOLN COMPARISON:  08/16/2023 chest radiograph FINDINGS: Cardiovascular: No filling defect is identified in the pulmonary arterial tree to suggest pulmonary embolus. Aortic and branch vessel atherosclerosis. Mediastinum/Nodes: Paucity of subcutaneous and mediastinal adipose tissues. Mildly enlarged left paratracheal lymph node 1.0 cm in short axis on image 68 series 4. Lungs/Pleura: Mucous causing fluid levels in both mainstem bronchi. Complete occlusion of the right lower lobe bronchus. Plugging segmental bronchi in the left lower lobe. Consolidation of the entire right lower lobe and right middle lobe, as well as much of the posterior portion of the right upper lobe, with associated air bronchograms. Consolidation and scattered nodularity along with varicoid bronchiectasis in the left lower lobe. Some of the areas of nodularity are probably areas of  cystic bronchiectasis. Scattered ill-defined nodules in the left lower lobe measure up to about 1 cm in the long axis. There is scattered small nodules in the left upper lobe, index nodule 7 by 5 mm on image 90 series 5. Background centrilobular emphysema. Moderate right pleural effusion is primarily sub pulmonic. Strictly speaking a right lung mass particularly in the right lower lobe cannot be excluded given the degree airspace opacity and airway truncation. Upper Abdomen: Scattered hypodense hepatic lesions once again favoring cysts, many similar to 05/01/2014. Abdominal aortic atherosclerosis. Nonspecific fluid density lesion of the right kidney upper pole on image 180 series 4 compatible with simple cyst. No further imaging workup of this lesion is indicated. Musculoskeletal: Thoracic kyphosis. Review of the MIP images confirms the above findings. IMPRESSION: 1. No filling defect is identified in the pulmonary arterial tree to suggest pulmonary embolus. 2. Consolidation of the entire right lower lobe and right middle lobe, as well as much of the posterior portion of the right upper lobe, with associated air bronchograms. Consolidation and scattered nodularity along with varicoid bronchiectasis in the left lower lobe. Some of the areas of nodularity are probably  areas of cystic bronchiectasis. Appearance compatible with multilobar pneumonia. Strictly speaking a right lung mass particularly in the right lower lobe cannot be excluded given the degree airspace opacity and airway truncation. 3. Scattered nodules in the left lung. Given the complex and extensive nature findings in this case, I recommend followup PA and lateral chest radiography in 4 weeks time after appropriate antibiotic therapy to reassess the lungs, and consideration of noncontrast chest CT or PET-CT in 1-3 months to reassess the underlying nodularity. 4. Moderate right pleural effusion is primarily sub pulmonic. 5. Scattered hypodense hepatic  lesions once again favoring cysts, much similar to 05/01/2014. 6. Thoracic kyphosis. 7. Aortic Atherosclerosis (ICD10-I70.0) and Emphysema (ICD10-J43.9). Electronically Signed   By: Ryan Salvage M.D.   On: 08/16/2023 08:38   DG Chest Portable 1 View Result Date: 08/16/2023 CLINICAL DATA:  Shortness of breath. EXAM: PORTABLE CHEST 1 VIEW COMPARISON:  02/16/2018 FINDINGS: Lungs are hyperexpanded. Dense consolidative disease is seen in the right mid and lower lung with some probable associated component of collapse. Associated right pleural effusion evident. There is patchy airspace disease at the left base. Interstitial markings are diffusely coarsened with chronic features. No acute bony abnormality. IMPRESSION: 1. Dense consolidative disease in the right mid and lower lung with some probable associated component of collapse. Imaging features compatible with pneumonia. Associated right pleural effusion. Consider chest CT with contrast to exclude central obstructing neoplasm. 2. Patchy airspace disease at the left base. Electronically Signed   By: Camellia Candle M.D.   On: 08/16/2023 06:56    Microbiology: Results for orders placed or performed during the hospital encounter of 08/16/23  Resp panel by RT-PCR (RSV, Flu A&B, Covid) Anterior Nasal Swab     Status: None   Collection Time: 08/16/23  5:39 AM   Specimen: Anterior Nasal Swab  Result Value Ref Range Status   SARS Coronavirus 2 by RT PCR NEGATIVE NEGATIVE Final   Influenza A by PCR NEGATIVE NEGATIVE Final   Influenza B by PCR NEGATIVE NEGATIVE Final    Comment: (NOTE) The Xpert Xpress SARS-CoV-2/FLU/RSV plus assay is intended as an aid in the diagnosis of influenza from Nasopharyngeal swab specimens and should not be used as a sole basis for treatment. Nasal washings and aspirates are unacceptable for Xpert Xpress SARS-CoV-2/FLU/RSV testing.  Fact Sheet for Patients: BloggerCourse.com  Fact Sheet for Healthcare  Providers: SeriousBroker.it  This test is not yet approved or cleared by the [place]  FDA and has been authorized for detection and/or diagnosis of SARS-CoV-2 by FDA under an Emergency Use Authorization (EUA). This EUA will remain in effect (meaning this test can be used) for the duration of the COVID-19 declaration under Section 564(b)(1) of the Act, 21 U.S.C. section 360bbb-3(b)(1), unless the authorization is terminated or revoked.     Resp Syncytial Virus by PCR NEGATIVE NEGATIVE Final    Comment: (NOTE) Fact Sheet for Patients: BloggerCourse.com  Fact Sheet for Healthcare Providers: SeriousBroker.it  This test is not yet approved or cleared by the [place]  FDA and has been authorized for detection and/or diagnosis of SARS-CoV-2 by FDA under an Emergency Use Authorization (EUA). This EUA will remain in effect (meaning this test can be used) for the duration of the COVID-19 declaration under Section 564(b)(1) of the Act, 21 U.S.C. section 360bbb-3(b)(1), unless the authorization is terminated or revoked.  Performed at [facility], [address] [place], [place] [address]   Respiratory (~20 pathogens) panel by PCR  Status: None   Collection Time: 08/16/23  6:22 PM   Specimen: Nasopharyngeal Swab; Respiratory  Result Value Ref Range Status   Adenovirus NOT DETECTED NOT DETECTED Final   Coronavirus 229E NOT DETECTED NOT DETECTED Final    Comment: (NOTE) The Coronavirus on the Respiratory Panel, DOES NOT test for the novel  Coronavirus (2019 nCoV)    Coronavirus HKU1 NOT DETECTED NOT DETECTED Final   Coronavirus NL63 NOT DETECTED NOT DETECTED Final   Coronavirus OC43 NOT DETECTED NOT DETECTED Final   Metapneumovirus NOT DETECTED NOT DETECTED Final   Rhinovirus / Enterovirus NOT DETECTED NOT DETECTED Final   Influenza A NOT DETECTED NOT DETECTED Final   Influenza B NOT DETECTED  NOT DETECTED Final   Parainfluenza Virus 1 NOT DETECTED NOT DETECTED Final   Parainfluenza Virus 2 NOT DETECTED NOT DETECTED Final   Parainfluenza Virus 3 NOT DETECTED NOT DETECTED Final   Parainfluenza Virus 4 NOT DETECTED NOT DETECTED Final   Respiratory Syncytial Virus NOT DETECTED NOT DETECTED Final   Bordetella pertussis NOT DETECTED NOT DETECTED Final   Bordetella Parapertussis NOT DETECTED NOT DETECTED Final   Chlamydophila pneumoniae NOT DETECTED NOT DETECTED Final   Mycoplasma pneumoniae NOT DETECTED NOT DETECTED Final    Comment: Performed at [facility], [address], [place], [place] [address]  Body fluid culture w Gram Stain     Status: None   Collection Time: 08/17/23 10:24 AM   Specimen: Pleural Fluid  Result Value Ref Range Status   Specimen Description PLEURAL  Final   Special Requests NONE  Final   Gram Stain NO WBC SEEN NO ORGANISMS SEEN   Final   Culture   Final    NO GROWTH 3 DAYS Performed at [facility], [address] [place], [place] [address]    Report Status 08/21/2023 FINAL  Final  Anaerobic culture w Gram Stain     Status: None   Collection Time: 08/22/23 12:22 PM   Specimen: Bronchoalveolar Lavage  Result Value Ref Range Status   Specimen Description BRONCHIAL ALVEOLAR LAVAGE  Final   Special Requests RLL  Final   Gram Stain   Final    FEW WBC PRESENT, PREDOMINANTLY PMN NO ORGANISMS SEEN    Culture   Final    NO ANAEROBES ISOLATED Performed at [facility], [address], [place], [place] [address]    Report Status 08/27/2023 FINAL  Final  Fungus Culture With Stain     Status: None (Preliminary result)   Collection Time: 08/22/23 12:48 PM   Specimen: Bronchial Alveolar Lavage; Respiratory  Result Value Ref Range Status   Fungus Stain Final report  Final    Comment: (NOTE) Performed At: [facility] [address] [place], [place] [id number] Jennette Shorter MD Ey:[id number]    Fungus (Mycology) Culture  PENDING  Incomplete   Fungal Source BRONCHIAL ALVEOLAR LAVAGE  Final    Comment: RLL Performed at [facility], [address] [place], [place] [address]   Culture, BAL-quantitative w Gram Stain     Status: None   Collection Time: 08/22/23 12:48 PM   Specimen: Bronchial Alveolar Lavage; Respiratory  Result Value Ref Range Status   Specimen Description BRONCHIAL ALVEOLAR LAVAGE  Final   Special Requests RLL  Final   Gram Stain NO WBC SEEN NO ORGANISMS SEEN   Final   Culture   Final    NO GROWTH 2 DAYS Performed at [facility], [address] [place], [place] [address]    Report Status 08/24/2023 FINAL  Final  Acid Fast Smear (AFB)     Status: None   Collection Time: 08/22/23 12:48 PM   Specimen: Bronchial Alveolar Lavage; Respiratory  Result Value Ref Range Status   AFB Specimen Processing Concentration  Final   Acid Fast Smear Negative  Final    Comment: (NOTE) Performed At: [facility] [address] [place], [place] [id number] Jennette Shorter MD Ey:[id number]    Source (AFB) BRONCHIAL ALVEOLAR LAVAGE  Final    Comment: RLL Performed at [facility], [address], [place], [place] [address]   Fungus Culture Result     Status: None   Collection Time: 08/22/23 12:48 PM  Result Value Ref Range Status   Result 1 Comment  Final    Comment: (NOTE) KOH/Calcofluor preparation:  no fungus observed. Performed At: [facility] [address] [place], [place] [id number] Jennette Shorter MD Ey:[id number]    Labs: CBC: Recent Labs  Lab 08/23/23 0636 08/26/23 0646 08/28/23 0631 08/29/23 0654  WBC 9.2 10.9* 12.0* 11.9*  HGB 12.2* 12.5* 11.8* 11.9*  HCT 37.6* 38.0* 35.7* 36.2*  MCV 85.8 85.0 85.2 85.2  PLT 523* 638* 648* 735*   Basic Metabolic Panel: Recent Labs  Lab 08/23/23 0636 08/26/23 0646 08/28/23 0631 08/29/23 0654  NA 134* 130* 130* 130*  K 5.0 4.4 4.4 4.9  CL 99 95* 96* 96*  CO2 29 22 24 24   GLUCOSE 104* 91 94 94   BUN 13 15 16 17   CREATININE 0.74 0.63 0.59* 0.72  CALCIUM 8.9 9.1 9.0 9.2  MG 2.0  --   --   --   PHOS  --   --  3.2 3.6   Liver Function Tests: Recent Labs  Lab 08/28/23 0631 08/29/23 0654  ALBUMIN 1.8* 1.9*   CBG: No results for input(s): GLUCAP in the last 168 hours.  Discharge time spent: greater than 30 minutes.  Author: Yetta Blanch, MD  [facility]

## 2023-08-29 NOTE — Progress Notes (Signed)
 Report given to Astronomer at [facility].  Metta Moats, BSN RN

## 2023-08-29 NOTE — Telephone Encounter (Signed)
 Can a [facility] schedule this CT, Thanks!!

## 2023-08-29 NOTE — Progress Notes (Signed)
 Navigator emails Cydney Valle [place] and Tonya Pendell, [facility] path techs, requesting tissue be sent to [facility] for molecular testing and PD-L1

## 2023-08-29 NOTE — TOC Transition Note (Signed)
 Transition of Care [facility]) - Discharge Note   Patient Details  Name: Fred Wise MRN: [id number] Date of Birth: 26-Apr-1955  [facility]) CM/SW Contact:  Lendia Dais, LCSW Phone Number: 08/29/2023, 11:35 AM   Clinical Narrative: CSW spoke with pt and pt's brother [name] about the pt dc'ing to [facility]. The pt and the brother were agreeable.  CSW contacted [facility] at 11:30 AM. RN report to [phone].       Final next level of care: Skilled Nursing Facility Barriers to Discharge: Barriers Resolved   Patient Goals and CMS Choice Patient states their goals for this hospitalization and ongoing recovery are:: To go home CMS Medicare.gov Compare Post Acute Care list provided to:: Patient Choice offered to / list presented to : Patient      Discharge Placement              Patient chooses bed at: [facility] Patient to be transferred to facility by: ambulance Name of family member notified: [name] (brother) Patient and family notified of of transfer: 08/29/23  Discharge Plan and Services Additional resources added to the After Visit Summary for   In-house Referral: Clinical Social Work Discharge Planning Services: CM Consult Post Acute Care Choice: Skilled Nursing Facility                    HH Arranged: RN, PT, Nurse's Aide, Social Work [facility] Agency: [facility] Date [facility] Agency Contacted: 08/27/23 Time HH Agency Contacted: 1100 Representative spoke with at [facility] Agency: [place]  Social Drivers of Health (SDOH) Interventions SDOH Screenings   Food Insecurity: Food Insecurity Present (08/17/2023)  Housing: Low Risk  (08/17/2023)  Transportation Needs: Unknown (08/17/2023)  Utilities: Not At Risk (08/17/2023)  Social Connections: Socially Isolated (08/17/2023)  Tobacco Use: High Risk (08/22/2023)     Readmission Risk Interventions     No data to display

## 2023-08-29 NOTE — TOC Progression Note (Signed)
 Transition of Care [facility]) - Progression Note    Patient Details  Name: Fred Wise MRN: [id number] Date of Birth: [birth year]  Transition of Care [facility]) CM/SW Contact  Lendia Dais, [place] Phone Number: 08/29/2023, 9:56 AM  Clinical Narrative: CSW confirmed w/ Leont[facility] at [facility] that they can take the pt today.   CSW submitted auth for snf and Auth for SNF was approved. Shara ID is [id number] from 08/06-08/08.  MD informed.    Expected Discharge Plan: Skilled Nursing Facility Barriers to Discharge: Continued Medical Work up, SNF Pending bed offer               Expected Discharge Plan and Services In-house Referral: Clinical Social Work Discharge Planning Services: CM Consult Post Acute Care Choice: Skilled Nursing Facility Living arrangements for the past 2 months: Apartment                           HH Arranged: Charity fundraiser, PT, Nurse's Aide, Social Work [employer] Agency: [facility] Date HH Agency Contacted: 08/27/23 Time HH Agency Contacted: 1100 Representative spoke with at [facility] Agency: [place]   Social Drivers of Health (SDOH) Interventions SDOH Screenings   Food Insecurity: Food Insecurity Present (08/17/2023)  Housing: Low Risk  (08/17/2023)  Transportation Needs: Unknown (08/17/2023)  Utilities: Not At Risk (08/17/2023)  Social Connections: Socially Isolated (08/17/2023)  Tobacco Use: High Risk (08/22/2023)    Readmission Risk Interventions     No data to display

## 2023-08-30 ENCOUNTER — Telehealth: Payer: Self-pay | Admitting: Internal Medicine

## 2023-08-30 NOTE — Telephone Encounter (Signed)
 Left the patient a voicemail with the appointment details.

## 2023-09-03 ENCOUNTER — Other Ambulatory Visit: Payer: Self-pay

## 2023-09-03 ENCOUNTER — Telehealth: Payer: Self-pay | Admitting: Internal Medicine

## 2023-09-04 ENCOUNTER — Telehealth: Payer: Self-pay | Admitting: Internal Medicine

## 2023-09-04 ENCOUNTER — Encounter (HOSPITAL_COMMUNITY): Payer: Self-pay | Admitting: Internal Medicine

## 2023-09-04 NOTE — Telephone Encounter (Signed)
 Patient is deceased.

## 2023-09-04 NOTE — Telephone Encounter (Signed)
 Spoke to the brother and he informed me that FredWise had passed away yesterday. I have informed the care team and canceled future appointments.

## 2023-09-05 ENCOUNTER — Ambulatory Visit: Admitting: Internal Medicine

## 2023-09-05 ENCOUNTER — Other Ambulatory Visit

## 2023-09-21 LAB — FUNGUS CULTURE WITH STAIN

## 2023-09-21 LAB — FUNGAL ORGANISM REFLEX

## 2023-09-21 LAB — FUNGUS CULTURE RESULT

## 2023-09-24 NOTE — Telephone Encounter (Signed)
 Attempted to contact [facility] to confirm scheduled appointments. Was unable to leave a voicemail with the scheduler, would not stop ringing and then was disconnected. I was unsuccessful in contacting the rehab facility.

## 2023-09-24 DEATH — deceased

## 2023-10-06 LAB — ACID FAST CULTURE WITH REFLEXED SENSITIVITIES (MYCOBACTERIA): Acid Fast Culture: NEGATIVE
